# Patient Record
Sex: Female | Born: 1955 | Hispanic: Refuse to answer | Smoking: Former smoker
Health system: Southern US, Community
[De-identification: ages and names within clinical notes are randomized; demographics above are authoritative.]

## PROBLEM LIST (undated history)

## (undated) ENCOUNTER — Inpatient Hospital Stay (HOSPITAL_COMMUNITY): Payer: Self-pay

## (undated) DIAGNOSIS — M810 Age-related osteoporosis without current pathological fracture: Secondary | ICD-10-CM

## (undated) DIAGNOSIS — Z8489 Family history of other specified conditions: Secondary | ICD-10-CM

## (undated) DIAGNOSIS — R102 Pelvic and perineal pain unspecified side: Secondary | ICD-10-CM

## (undated) DIAGNOSIS — M5430 Sciatica, unspecified side: Secondary | ICD-10-CM

## (undated) DIAGNOSIS — K219 Gastro-esophageal reflux disease without esophagitis: Secondary | ICD-10-CM

## (undated) DIAGNOSIS — N920 Excessive and frequent menstruation with regular cycle: Secondary | ICD-10-CM

## (undated) DIAGNOSIS — Z87828 Personal history of other (healed) physical injury and trauma: Secondary | ICD-10-CM

## (undated) DIAGNOSIS — G4700 Insomnia, unspecified: Secondary | ICD-10-CM

## (undated) DIAGNOSIS — N83201 Unspecified ovarian cyst, right side: Secondary | ICD-10-CM

## (undated) DIAGNOSIS — D649 Anemia, unspecified: Secondary | ICD-10-CM

## (undated) DIAGNOSIS — N946 Dysmenorrhea, unspecified: Secondary | ICD-10-CM

## (undated) DIAGNOSIS — G8929 Other chronic pain: Secondary | ICD-10-CM

## (undated) DIAGNOSIS — R531 Weakness: Secondary | ICD-10-CM

## (undated) DIAGNOSIS — G20A1 Parkinson's disease without dyskinesia, without mention of fluctuations: Secondary | ICD-10-CM

## (undated) DIAGNOSIS — R2681 Unsteadiness on feet: Secondary | ICD-10-CM

## (undated) HISTORY — PX: DILATION AND CURETTAGE OF UTERUS: SHX78

## (undated) HISTORY — DX: Gastro-esophageal reflux disease without esophagitis: K21.9

## (undated) HISTORY — PX: TUBAL LIGATION: SHX77

## (undated) HISTORY — DX: Sciatica, unspecified side: M54.30

## (undated) HISTORY — DX: Parkinson's disease without dyskinesia, without mention of fluctuations: G20.A1

## (undated) HISTORY — PX: COLONOSCOPY: SHX174

---

## 2021-06-07 ENCOUNTER — Encounter (HOSPITAL_COMMUNITY): Payer: Self-pay | Admitting: Emergency Medicine

## 2021-06-07 ENCOUNTER — Other Ambulatory Visit: Payer: Self-pay

## 2021-06-07 ENCOUNTER — Ambulatory Visit (INDEPENDENT_AMBULATORY_CARE_PROVIDER_SITE_OTHER): Payer: Medicare Other

## 2021-06-07 ENCOUNTER — Ambulatory Visit (HOSPITAL_COMMUNITY)
Admission: EM | Admit: 2021-06-07 | Discharge: 2021-06-07 | Disposition: A | Discharge status: Home / Self Care | Payer: Medicare Other | Attending: Family Medicine | Admitting: Family Medicine

## 2021-06-07 DIAGNOSIS — M545 Low back pain, unspecified: Secondary | ICD-10-CM

## 2021-06-07 DIAGNOSIS — G8929 Other chronic pain: Secondary | ICD-10-CM | POA: Diagnosis not present

## 2021-06-07 DIAGNOSIS — M5441 Lumbago with sciatica, right side: Secondary | ICD-10-CM | POA: Diagnosis not present

## 2021-06-07 MED ORDER — TIZANIDINE HCL 4 MG PO TABS: 4.0000 mg | ORAL_TABLET | Freq: Three times a day (TID) | ORAL | 0 refills | Status: DC | PRN

## 2021-06-07 MED ORDER — IBUPROFEN 800 MG PO TABS: 800.0000 mg | ORAL_TABLET | Freq: Three times a day (TID) | ORAL | 0 refills | Status: DC | PRN

## 2021-06-07 MED ORDER — KETOROLAC TROMETHAMINE 30 MG/ML IJ SOLN
INTRAMUSCULAR | Status: AC
  Filled 2021-06-07: qty 1

## 2021-06-07 MED ORDER — KETOROLAC TROMETHAMINE 30 MG/ML IJ SOLN
30.0000 mg | Freq: Once | INTRAMUSCULAR | Status: AC
  Administered 2021-06-07: 30 mg via INTRAMUSCULAR

## 2021-06-07 NOTE — Discharge Instructions (Addendum)
.  Your x-rays showed a good bit of arthritis and other degenerative changes and scoliosis the scoliosis can develop from the degenerative changes  Take ibuprofen 800 mg 1 every 8 hours as needed for pain.  Take tizanidine 4 mg 1 tablet every 8 hours as needed for muscle pain or spasms.  This medication can make you sleepy or dizzy.

## 2021-06-07 NOTE — ED Triage Notes (Signed)
Low back pain that radiates to right leg for 2 months, has worsened recently.

## 2021-06-07 NOTE — ED Provider Notes (Signed)
[facility]    CSN: [id number] Arrival date & time: 06/07/21  1724      History   Chief Complaint Chief Complaint  Patient presents with   Back Pain   Leg Pain    HPI Janet Solomon is a 66 y.o. female.    Back Pain Associated symptoms: leg pain   Leg Pain Associated symptoms: back pain   Here with pain that begins in her right buttock area and radiates down to around her right knee.  That began about 2 months ago.  In the last week it has been worse; so much that she cannot sleep well at night  No recent trauma.  She has had an injury to her back when she was lifting heavy boxes several years ago.  No fever, no dysuria, and no hematuria.  History reviewed. No pertinent past medical history.  There are no problems to display for this patient.   History reviewed. No pertinent surgical history.  OB History   No obstetric history on file.      Home Medications    Prior to Admission medications   Medication Sig Start Date End Date Taking? Authorizing Provider  ibuprofen (ADVIL) 800 MG tablet Take 1 tablet (800 mg total) by mouth every 8 (eight) hours as needed (pain). 06/07/21  Yes Camron Monday, Gwenlyn Perking, MD  tiZANidine (ZANAFLEX) 4 MG tablet Take 1 tablet (4 mg total) by mouth every 8 (eight) hours as needed for muscle spasms. 06/07/21  Yes Barrett Henle, MD    Family History No family history on file.  Social History     Allergies   Patient has no known allergies.   Review of Systems Review of Systems  Musculoskeletal:  Positive for back pain.    Physical Exam Triage Vital Signs ED Triage Vitals  Enc Vitals Group     BP 06/07/21 1740 123/87     Pulse Rate 06/07/21 1739 73     Resp 06/07/21 1739 16     Temp 06/07/21 1739 98.3 F (36.8 C)     Temp Source 06/07/21 1739 Oral     SpO2 06/07/21 1739 97 %     Weight --      Height --      Head Circumference --      Peak Flow --      Pain Score 06/07/21 1739 7     Pain  Loc --      Pain Edu? --      Excl. in [place]? --    No data found.  Updated Vital Signs BP 123/87    Pulse 73    Temp 98.3 F (36.8 C) (Oral)    Resp 16    SpO2 97%   Visual Acuity Right Eye Distance:   Left Eye Distance:   Bilateral Distance:    Right Eye Near:   Left Eye Near:    Bilateral Near:     Physical Exam Vitals reviewed.  Constitutional:      General: She is not in acute distress.    Appearance: She is not toxic-appearing.  Cardiovascular:     Rate and Rhythm: Normal rate and regular rhythm.     Heart sounds: No murmur heard. Pulmonary:     Effort: Pulmonary effort is normal.     Breath sounds: Normal breath sounds.  Musculoskeletal:     Comments: Tenderness of right buttock area. SLR causes radiation of pain down to calf.  Skin:  Coloration: Skin is not jaundiced or pale.     Comments: Has some excoriated areas  on her bilateral posterior shoulders, and a little on upper right arm. No sign of secondary infection. No rash seen otherwise  Neurological:     Mental Status: She is alert and oriented to person, place, and time.  Psychiatric:        Behavior: Behavior normal.     UC Treatments / Results  Labs (all labs ordered are listed, but only abnormal results are displayed) Labs Reviewed - No data to display  EKG   Radiology DG Lumbar Spine Complete  Result Date: 06/07/2021 CLINICAL DATA:  low back pain with radiation to right knee EXAM: LUMBAR SPINE - COMPLETE 4+ VIEW COMPARISON:  None. FINDINGS: Markedly limited evaluation due to overlapping osseous structures and overlying soft tissues. Five non-rib-bearing lumbar vertebral bodies. There is no evidence of lumbar spine fracture. Multilevel at least moderate degenerative changes of the spine with rotatory dextroscoliosis of the lumbar spine centered at the L3 level with associated 1 cm rightward translation of L3 on L4. Atherosclerotic plaque. IMPRESSION: 1. No acute displaced fracture or traumatic  listhesis of the lumbar spine in a patient with dextroscoliosis of the lumbar spine and associated degenerative changes. Markedly limited evaluation due to overlapping osseous structures and overlying soft tissues. Consider cross-sectional imaging for further evaluation. 2.  Aortic Atherosclerosis (ICD10-I70.0). Electronically Signed   By: Iven Finn M.D.   On: 06/07/2021 18:06    Procedures Procedures (including critical care time)  Medications Ordered in UC Medications  ketorolac (TORADOL) 30 MG/ML injection 30 mg (has no administration in time range)    Initial Impression / Assessment and Plan / UC Course  I have reviewed the triage vital signs and the nursing notes.  Pertinent labs & imaging results that were available during my care of the patient were reviewed by me and considered in my medical decision making (see chart for details).     Xray shows degenerative changes and scoliosis. Will treat with ibuprofen and tizanidine. Request made to find her a PCP Final Clinical Impressions(s) / UC Diagnoses   Final diagnoses:  Chronic right-sided low back pain with right-sided sciatica     Discharge Instructions      .Your x-rays showed a good bit of arthritis and other degenerative changes and scoliosis the scoliosis can develop from the degenerative changes  Take ibuprofen 800 mg 1 every 8 hours as needed for pain.  Take tizanidine 4 mg 1 tablet every 8 hours as needed for muscle pain or spasms.  This medication can make you sleepy or dizzy.     ED Prescriptions     Medication Sig Dispense Auth. Provider   tiZANidine (ZANAFLEX) 4 MG tablet Take 1 tablet (4 mg total) by mouth every 8 (eight) hours as needed for muscle spasms. 30 tablet Barrett Henle, MD   ibuprofen (ADVIL) 800 MG tablet Take 1 tablet (800 mg total) by mouth every 8 (eight) hours as needed (pain). 60 tablet Andriel Omalley, Gwenlyn Perking, MD      PDMP not reviewed this encounter.   Barrett Henle,  MD 06/07/21 [phone]

## 2021-06-19 ENCOUNTER — Encounter: Payer: Self-pay | Admitting: Family

## 2021-06-19 ENCOUNTER — Other Ambulatory Visit: Payer: Self-pay

## 2021-06-19 ENCOUNTER — Ambulatory Visit (INDEPENDENT_AMBULATORY_CARE_PROVIDER_SITE_OTHER): Payer: Medicare Other | Admitting: Family

## 2021-06-19 VITALS — BP 136/84 | HR 64 | Temp 97.5°F | Ht 60.63 in | Wt 135.8 lb

## 2021-06-19 DIAGNOSIS — M5430 Sciatica, unspecified side: Secondary | ICD-10-CM | POA: Insufficient documentation

## 2021-06-19 DIAGNOSIS — M5431 Sciatica, right side: Secondary | ICD-10-CM

## 2021-06-19 DIAGNOSIS — Z1231 Encounter for screening mammogram for malignant neoplasm of breast: Secondary | ICD-10-CM

## 2021-06-19 DIAGNOSIS — E2839 Other primary ovarian failure: Secondary | ICD-10-CM

## 2021-06-19 MED ORDER — PREDNISONE 10 MG PO TABS: ORAL_TABLET | ORAL | 0 refills | Status: AC

## 2021-06-19 NOTE — Patient Instructions (Signed)
Welcome to [facility]! It was a pleasure meeting you today.  As discussed, I have sent prednisone to your pharmacy to help with your sciatica pain.  I have also sent a referral to Orthopedic office as this has been a chronic problem for you to have imaging and/or MRI to see what else may causing your pain.   See the handout attached and review this for tips/exercises to help you prevent future episodes, once you are feeling a little better.  Let us know if you have any future concerns!     PLEASE NOTE:  If you had any LAB tests please let us know if you have not heard back within a few days. You may see your results on MyChart before we have a chance to review them but we will give you a call once they are reviewed by [place]. If we ordered any REFERRALS today, please let us know if you have not heard from their office within the next week.  Let us know through MyChart if you are needing REFILLS, or have your pharmacy send [place] the request. You can also use MyChart to communicate with me or any office staff.  Please try these tips to maintain a healthy lifestyle:  Eat most of your calories during the day when you are active. Eliminate processed foods including packaged sweets (pies, cakes, cookies), reduce intake of potatoes, white bread, white pasta, and white rice. Look for whole grain options, oat flour or almond flour.  Each meal should contain half fruits/vegetables, one quarter protein, and one quarter carbs (no bigger than a computer mouse).  Cut down on sweet beverages. This includes juice, soda, and sweet tea. Also watch fruit intake, though this is a healthier sweet option, it still contains natural sugar! Limit to 3 servings daily.  Drink at least 1 glass of water with each meal and aim for at least 8 glasses per day  Exercise at least 150 minutes every week.

## 2021-06-19 NOTE — Progress Notes (Signed)
New Patient Office Visit  Subjective:  Patient ID: Janet Solomon, female    DOB: December 05, 1955  Age: 66 y.o. MRN: [id number]  CC:  Chief Complaint  Patient presents with   Establish Care   Sciatica    Follow up from urgent care; 06/07/2021 Pt states that it is worsening. (Rt Leg)    HPI Janet Solomon presents for establishing care and to discuss one chronic problem. Pain She reports chronic sciatic pain on right side. There was not an injury that may have caused the pain. The pain started about a year ago and is gradually worsening. The pain does radiate right leg. The pain is described as burning and tingling, is 9/10 in intensity, occurring every day. Symptoms are worse in the: nighttime  Aggravating factors: recumbency She has tried application of ice, acetaminophen, NSAIDs, and prescription pain relievers with mild relief.   Past Medical History:  Diagnosis Date   GERD (gastroesophageal reflux disease)    Several years    Past Surgical History:  Procedure Laterality Date   CESAREAN SECTION  1986: 1988   TUBAL LIGATION  1988    Family History  Problem Relation Age of Onset   Cancer Mother    Diabetes Mother    Heart disease Mother    Cancer Sister    Varicose Veins Sister    Cancer Sister     Social History   Socioeconomic History   Marital status: Divorced    Spouse name: Not on file   Number of children: Not on file   Years of education: Not on file   Highest education level: Not on file  Occupational History   Not on file  Tobacco Use   Smoking status: Former    Packs/day: 1.50    Years: 15.00    Pack years: 22.50    Types: Cigarettes    Quit date: 07/24/2017    Years since quitting: 3.9   Smokeless tobacco: Never   Tobacco comments:    Quit!  Substance and Sexual Activity   Alcohol use: Not on file   Drug use: Never   Sexual activity: Not Currently  Other Topics Concern   Not on file  Social History Narrative   Not on  file   Social Determinants of Health   Financial Resource Strain: Not on file  Food Insecurity: Not on file  Transportation Needs: Not on file  Physical Activity: Not on file  Stress: Not on file  Social Connections: Not on file  Intimate Partner Violence: Not on file    Objective:   Today's Vitals: BP 136/84    Pulse 64    Temp (!) 97.5 F (36.4 C) (Temporal)    Ht 5' 0.63" (1.54 m) Comment: with shoes   Wt 135 lb 12.8 oz (61.6 kg)    SpO2 97%    BMI 25.97 kg/m   Physical Exam Vitals and nursing note reviewed.  Constitutional:      Appearance: Normal appearance.  Cardiovascular:     Rate and Rhythm: Normal rate and regular rhythm.  Pulmonary:     Effort: Pulmonary effort is normal.     Breath sounds: Normal breath sounds.  Musculoskeletal:     Right hip: Decreased range of motion.  Skin:    General: Skin is warm and dry.  Neurological:     Mental Status: She is alert.  Psychiatric:        Mood and Affect: Mood normal.  Behavior: Behavior normal.    Assessment & Plan:   Problem List Items Addressed This Visit       Nervous and Auditory   Chronic sciatica of right side - Primary    reports having for about a year & 1/2 every day - recently seen for an exacerbated episode 2 weeks ago in urgent care and given steroid injection, Ibuprofen & Tizanidine, but could not tolerate the Tizanidine and pain is returning. Starting a pred pack, advised on use & SE, handout provided on stretches/exercises to try when it is feeling better. Also sending referral, she has questions about spinal injections and if they could help.      Relevant Medications   predniSONE (DELTASONE) 10 MG tablet   Other Relevant Orders   Ambulatory referral to Orthopedic Surgery   Other Visit Diagnoses     Encounter for screening mammogram for breast cancer       Relevant Orders   MM Digital Screening   Estrogen deficiency       Relevant Orders   DG Bone Density       Outpatient  Encounter Medications as of 06/19/2021  Medication Sig   predniSONE (DELTASONE) 10 MG tablet Take 5 tablets (50 mg total) by mouth daily with breakfast for 1 day, THEN 4 tablets (40 mg total) daily with breakfast for 2 days, THEN 3 tablets (30 mg total) daily with breakfast for 1 day, THEN 2 tablets (20 mg total) daily with breakfast for 1 day, THEN 1 tablet (10 mg total) daily with breakfast for 1 day.   [DISCONTINUED] ibuprofen (ADVIL) 800 MG tablet Take 1 tablet (800 mg total) by mouth every 8 (eight) hours as needed (pain). (Patient not taking: Reported on 06/19/2021)   [DISCONTINUED] tiZANidine (ZANAFLEX) 4 MG tablet Take 1 tablet (4 mg total) by mouth every 8 (eight) hours as needed for muscle spasms. (Patient not taking: Reported on 06/19/2021)   No facility-administered encounter medications on file as of 06/19/2021.    Follow-up: Return for any future concerns.   Dulce Sellar, NP

## 2021-06-19 NOTE — Assessment & Plan Note (Addendum)
reports having for about a year & 1/2 every day - recently seen for an exacerbated episode 2 weeks ago in urgent care and given steroid injection, Ibuprofen & Tizanidine, but could not tolerate the Tizanidine and pain is returning. Starting a pred pack, advised on use & SE, handout provided on stretches/exercises to try when it is feeling better. Also sending referral, she has questions about spinal injections and if they could help.

## 2021-06-23 ENCOUNTER — Encounter: Payer: Self-pay | Admitting: Physician Assistant

## 2021-06-23 ENCOUNTER — Ambulatory Visit: Payer: Medicare Other | Admitting: Physician Assistant

## 2021-06-23 ENCOUNTER — Other Ambulatory Visit: Payer: Self-pay

## 2021-06-23 VITALS — Ht 60.0 in | Wt 135.0 lb

## 2021-06-23 DIAGNOSIS — M5431 Sciatica, right side: Secondary | ICD-10-CM | POA: Diagnosis not present

## 2021-06-23 NOTE — Progress Notes (Signed)
Office Visit Note   Patient: Janet Solomon           Date of Birth: [birth year]           MRN: [id number] Visit Date: 06/23/2021              Requested by: Dulce Sellar, NP [address] [place],  [place] [address] PCP: Dulce Sellar, NP  Chief Complaint  Patient presents with   Lower Back - Pain      HPI: Patient is a pleasant 66 year old woman with a 3-year history of lower back pain with associated sciatica.  She thinks this first began 3 years ago with lifting.  She had back pain at first that came and went.  She started developing more pain in her posterior right buttock that is now radiating down into her calf.  She denies any loss of bowel or bladder control she is had no previous back surgery.  She was seen in urgent care as her symptoms have exacerbated and given initially ibuprofen and a muscle relaxer.  She also had now it has been given steroid which she just took the first dose of yesterday so far it has not helped her.  She finds her activity is limited.  She has difficulty sitting to a standing position.  She denies any giving way of her legs or paresthesias.  She has been given exercises to do which are not helpful  Assessment & Plan: Visit Diagnoses:  1. Chronic sciatica of right side     Plan: Patient has a long history of lower back pain that is increasing with significant sciatic symptoms running down her right leg.  She has failed conservative treatment including anti-inflammatories rest and exercises.  Given the severity of her pain I have recommended an MRI.  Her x-rays do demonstrate significant scoliosis and moderate degenerative changes with a scoliosis centered at L3 to the right.  She will follow-up once the MRI is complete she might be a good candidate for epidural steroid injections  Follow-Up Instructions: No follow-ups on file.   Ortho Exam  Patient is alert, oriented, no adenopathy, well-dressed, normal affect, normal  respiratory effort. Examination Pleasant 66 year old woman ambulates slowly and slowly goes from a sitting to bending position.  She has no tenderness over the spine itself.  Bending forward does reproduce right buttock pain.  She has equal strength with dorsiflexion and plantarflexion of her ankles extension and flexion of her knees abduction and abduction and flexion of her hips.  She has a straight leg raise which produces pain in her right thigh.  Straight leg raise on the left also produces pain in her right thigh.  Imaging: No results found. No images are attached to the encounter.  Labs: No results found for: HGBA1C, ESRSEDRATE, CRP, LABURIC, REPTSTATUS, GRAMSTAIN, CULT, LABORGA   No results found for: ALBUMIN, PREALBUMIN, CBC  No results found for: MG No results found for: VD25OH  No results found for: PREALBUMIN No flowsheet data found.   Body mass index is 26.37 kg/m.  Orders:  Orders Placed This Encounter  Procedures   MR Lumbar Spine w/o contrast   No orders of the defined types were placed in this encounter.    Procedures: No procedures performed  Clinical Data: No additional findings.  ROS:  All other systems negative, except as noted in the HPI. Review of Systems  Objective: Vital Signs: Ht 5' (1.524 m)    Wt 135 lb (61.2 kg)  BMI 26.37 kg/m   Specialty Comments:  No specialty comments available.  PMFS History: Patient Active Problem List   Diagnosis Date Noted   Chronic sciatica of right side 06/19/2021   Past Medical History:  Diagnosis Date   GERD (gastroesophageal reflux disease)    Several years    Family History  Problem Relation Age of Onset   Cancer Mother    Diabetes Mother    Heart disease Mother    Cancer Sister    Varicose Veins Sister    Cancer Sister     Past Surgical History:  Procedure Laterality Date   CESAREAN SECTION  1986: 1988   TUBAL LIGATION  1988   Social History   Occupational History   Not on file   Tobacco Use   Smoking status: Former    Packs/day: 1.50    Years: 15.00    Pack years: 22.50    Types: Cigarettes    Quit date: 07/24/2017    Years since quitting: 3.9   Smokeless tobacco: Never   Tobacco comments:    Quit!  Substance and Sexual Activity   Alcohol use: Not on file   Drug use: Never   Sexual activity: Not Currently

## 2021-06-29 ENCOUNTER — Other Ambulatory Visit: Payer: Self-pay | Admitting: Physician Assistant

## 2021-06-29 ENCOUNTER — Telehealth: Payer: Self-pay

## 2021-06-29 MED ORDER — MELOXICAM 15 MG PO TABS: 15.0000 mg | ORAL_TABLET | Freq: Every day | ORAL | 2 refills | Status: DC

## 2021-06-29 NOTE — Telephone Encounter (Signed)
Patient called stating that the prednisone is not working and she has yet to hear from [facility] [employer]. She would like to know if anything else can be proscribed until her next apt for pain ?

## 2021-06-29 NOTE — Telephone Encounter (Signed)
I did speak with the patient she will discontinue the prednisone and try using Mobic.  She does not want anything stronger as she watches her grandchild.  She also has her MRI scheduled for the 18th.  I told her to keep me informed if things got worse

## 2021-07-09 ENCOUNTER — Other Ambulatory Visit: Payer: Self-pay | Admitting: Physician Assistant

## 2021-07-09 ENCOUNTER — Telehealth: Payer: Self-pay | Admitting: Orthopedic Surgery

## 2021-07-09 MED ORDER — DIAZEPAM 5 MG PO TABS: 5.0000 mg | ORAL_TABLET | Freq: Once | ORAL | 0 refills | Status: AC

## 2021-07-09 NOTE — Telephone Encounter (Signed)
Ms. Janet Solomon states that her MRI appt is Saturday 07/11/21. [place] is supposed to call in something to help her relax for the test.  She uses the [facility] [address].  Call back # [phone].

## 2021-07-11 ENCOUNTER — Other Ambulatory Visit: Payer: Self-pay

## 2021-07-11 ENCOUNTER — Ambulatory Visit
Admission: RE | Admit: 2021-07-11 | Discharge: 2021-07-11 | Disposition: A | Discharge status: Home / Self Care | Payer: Medicare Other | Source: Ambulatory Visit | Attending: Physician Assistant | Admitting: Physician Assistant

## 2021-07-11 DIAGNOSIS — M48061 Spinal stenosis, lumbar region without neurogenic claudication: Secondary | ICD-10-CM | POA: Diagnosis not present

## 2021-07-11 DIAGNOSIS — M5431 Sciatica, right side: Secondary | ICD-10-CM

## 2021-07-17 ENCOUNTER — Telehealth: Payer: Self-pay | Admitting: Orthopaedic Surgery

## 2021-07-17 NOTE — Telephone Encounter (Signed)
Pt called requesting  a call back concerning MRI results. Pt states PA Persons called her and pt seems confused on who called her and let vm. Please call pt at [phone].

## 2021-07-20 ENCOUNTER — Other Ambulatory Visit: Payer: Self-pay

## 2021-07-20 DIAGNOSIS — M5431 Sciatica, right side: Secondary | ICD-10-CM

## 2021-07-23 ENCOUNTER — Ambulatory Visit
Admission: RE | Admit: 2021-07-23 | Discharge: 2021-07-23 | Disposition: A | Discharge status: Home / Self Care | Payer: Medicare Other | Source: Ambulatory Visit | Attending: Physician Assistant | Admitting: Physician Assistant

## 2021-07-23 DIAGNOSIS — Z780 Asymptomatic menopausal state: Secondary | ICD-10-CM | POA: Diagnosis not present

## 2021-07-23 DIAGNOSIS — M5431 Sciatica, right side: Secondary | ICD-10-CM

## 2021-07-24 ENCOUNTER — Other Ambulatory Visit: Payer: Self-pay

## 2021-07-24 ENCOUNTER — Telehealth: Payer: Self-pay | Admitting: Physician Assistant

## 2021-07-24 DIAGNOSIS — M5431 Sciatica, right side: Secondary | ICD-10-CM

## 2021-07-24 DIAGNOSIS — N9489 Other specified conditions associated with female genital organs and menstrual cycle: Secondary | ICD-10-CM

## 2021-07-24 NOTE — Telephone Encounter (Signed)
[facility] did a call report on pt's u/s. Asked if we had access to the report and I let her know we could see it in pt's chart. She wanted me to let you know ready for review. ?

## 2021-08-05 ENCOUNTER — Other Ambulatory Visit: Payer: Self-pay

## 2021-08-05 ENCOUNTER — Ambulatory Visit: Payer: Self-pay

## 2021-08-05 ENCOUNTER — Ambulatory Visit: Payer: Medicare Other | Admitting: Physical Medicine and Rehabilitation

## 2021-08-05 ENCOUNTER — Encounter: Payer: Self-pay | Admitting: Physical Medicine and Rehabilitation

## 2021-08-05 VITALS — BP 129/85 | HR 102

## 2021-08-05 DIAGNOSIS — M5416 Radiculopathy, lumbar region: Secondary | ICD-10-CM

## 2021-08-05 MED ORDER — METHYLPREDNISOLONE ACETATE 80 MG/ML IJ SUSP
80.0000 mg | Freq: Once | INTRAMUSCULAR | Status: AC
  Administered 2021-08-05: 80 mg

## 2021-08-05 NOTE — Patient Instructions (Signed)

## 2021-08-05 NOTE — Progress Notes (Signed)
Pt state lower back pain that travels to her right leg. Pt state any movement makes the pain worse. Pt state she takes pain med sot help ease her pain. ? ?Numeric Pain Rating Scale and Functional Assessment ?Average Pain 5 ? ? ?In the last MONTH (on 0-10 scale) has pain interfered with the following? ? ?1. General activity like being  able to carry out your everyday physical activities such as walking, climbing stairs, carrying groceries, or moving a chair?  ?Rating(10) ? ? ?+Driver, -BT, -Dye Allergies. Pt not sure ? ?

## 2021-08-09 NOTE — Progress Notes (Signed)
? ?Janet Solomon - 65 y.o. female MRN [id number]  Date of birth: 09/27/1955 ? ?Office Visit Note: ?Visit Date: 08/05/2021 ?PCP: Dulce Sellar, NP ?Referred by: Dulce Sellar, NP ? ?Subjective: ?Chief Complaint  ?Patient presents with  ? Lower Back - Pain  ? Right Leg - Pain  ? ?HPI:  Janet Solomon is a 66 y.o. female who comes in today at the request of [place] Persons, PA-C for planned Right L5-S1 Lumbar Interlaminar epidural steroid injection with fluoroscopic guidance.  The patient has failed conservative care including home exercise, medications, time and activity modification.  This injection will be diagnostic and hopefully therapeutic.  Please see requesting physician notes for further details and justification. MRI reviewed with images and spine model.  MRI reviewed in the note below.  ? ?ROS Otherwise per HPI. ? ?Assessment & Plan: ?Visit Diagnoses:  ?  ICD-10-CM   ?1. Lumbar radiculopathy  M54.16 XR C-ARM NO REPORT  ?  Epidural Steroid injection  ?  methylPREDNISolone acetate (DEPO-MEDROL) injection 80 mg  ?  ?  ?Plan: No additional findings.  ? ?Meds & Orders:  ?Meds ordered this encounter  ?Medications  ? methylPREDNISolone acetate (DEPO-MEDROL) injection 80 mg  ?  ?Orders Placed This Encounter  ?Procedures  ? XR C-ARM NO REPORT  ? Epidural Steroid injection  ?  ?Follow-up: Return if symptoms worsen or fail to improve.  ? ?Procedures: ?No procedures performed  ?Lumbar Epidural Steroid Injection - Interlaminar Approach with Fluoroscopic Guidance ? ?Patient: Janet Solomon      ?Date of Birth: [birth year] ?MRN: [id number] ?PCP: Dulce Sellar, NP      ?Visit Date: 08/05/2021 ?  ?Universal Protocol:    ? ?Consent Given By: the patient ? ?Position: PRONE ? ?Additional Comments: ?Vital signs were monitored before and after the procedure. ?Patient was prepped and draped in the usual sterile fashion. ?The correct patient, procedure, and site was  verified. ? ? ?Injection Procedure Details:  ? ?Procedure diagnoses: Lumbar radiculopathy [M54.16]  ? ?Meds Administered:  ?Meds ordered this encounter  ?Medications  ? methylPREDNISolone acetate (DEPO-MEDROL) injection 80 mg  ?  ? ?Laterality: Right ? ?Location/Site:  L5-S1 ? ?Needle: 3.5 in., 20 ga. Tuohy ? ?Needle Placement: Paramedian epidural ? ?Findings:  ? -Comments: Excellent flow of contrast into the epidural space. ? ?Procedure Details: ?Using a paramedian approach from the side mentioned above, the region overlying the inferior lamina was localized under fluoroscopic visualization and the soft tissues overlying this structure were infiltrated with 4 ml. of 1% Lidocaine without Epinephrine. The Tuohy needle was inserted into the epidural space using a paramedian approach.  ? ?The epidural space was localized using loss of resistance along with counter oblique bi-planar fluoroscopic views.  After negative aspirate for air, blood, and CSF, a 2 ml. volume of Isovue-250 was injected into the epidural space and the flow of contrast was observed. Radiographs were obtained for documentation purposes.   ? ?The injectate was administered into the level noted above. ? ? ?Additional Comments:  ?The patient tolerated the procedure well ?Dressing: 2 x 2 sterile gauze and Band-Aid ?  ? ?Post-procedure details: ?Patient was observed during the procedure. ?Post-procedure instructions were reviewed. ? ?Patient left the clinic in stable condition.  ? ?Clinical History: ?MRI LUMBAR SPINE WITHOUT CONTRAST ?  ?TECHNIQUE: ?Multiplanar, multisequence MR imaging of the lumbar spine was ?performed. No intravenous contrast was administered. ?  ?COMPARISON:  Radiograph from 06/07/2021. ?  ?FINDINGS: ?Segmentation: Standard. Lowest well-formed disc space labeled  the ?L5-S1 level. ?  ?Alignment: Moderate dextroscoliosis with apex at L2. Chronic ?bilateral pars defects at L5 with associated 12 mm ?spondylolisthesis. Trace stepwise  retrolisthesis with exaggeration ?of the normal lumbar lordosis seen elsewhere within the lumbar ?spine. ?  ?Vertebrae: Vertebral body height maintained without acute or chronic ?fracture. Bone marrow signal intensity diffusely heterogeneous with ?a few scattered benign hemangiomata present. No worrisome osseous ?lesions. No abnormal marrow edema. ?  ?Conus medullaris and cauda equina: Conus extends to the L1 level. ?Conus and cauda equina appear normal. ?  ?Paraspinal and other soft tissues: Paraspinous soft tissues ?demonstrate no acute finding. T2 hyperintense cystic lesion ?measuring up to approximately 5.4 cm seen within the right adnexa, ?partially visualized, and indeterminate (series 8, image 13). ?Remainder of the visualized visceral structures grossly ?unremarkable. ?  ?Disc levels: ?  ?T11-12: Diffuse disc bulge, asymmetric to the right. Right greater ?than left facet hypertrophy. Resultant mild canal stenosis. Moderate ?right foraminal narrowing. Left neural foramina remains patent. ?  ?T12-L1: Diffuse disc bulge with disc desiccation. Reactive endplate ?spurring. Mild left greater than right facet hypertrophy. Resultant ?mild narrowing of the left lateral recess. Central canal remains ?patent. Mild left foraminal narrowing. Right neural foramina remains ?patent. ?  ?L1-2: Degenerative intervertebral disc space narrowing with diffuse ?disc bulge and disc desiccation. Reactive endplate spurring. Mild ?facet hypertrophy. No significant spinal stenosis. Foramina remain ?grossly patent. ?  ?L2-3: Degenerative intervertebral disc space narrowing with diffuse ?disc bulge and disc desiccation. Reactive endplate spurring. ?Moderate right worse than left facet hypertrophy. Mild narrowing of ?the left lateral recess without significant spinal stenosis. Mild ?left L2 foraminal narrowing. Right neural foramen remains patent. ?  ?L3-4: Mild intervertebral disc space narrowing with diffuse disc ?bulge and disc  desiccation. Moderate right with mild left facet ?hypertrophy. No significant spinal stenosis. Moderate right worse ?than left L3 foraminal stenosis. ?  ?L4-5: Disc desiccation. Small central disc protrusion indents the ?ventral thecal sac (series 13, image 31). Moderate right worse than ?left facet hypertrophy. Mild narrowing of the lateral recesses, ?slightly worse on the left. Central canal remains patent. Moderate ?right with mild to moderate left L4 foraminal stenosis. ?  ?L5-S1: Chronic bilateral pars defects with 12 mm spondylolisthesis. ?Advanced intervertebral disc space narrowing with broad posterior ?pseudo disc bulge/uncovering. Moderate bilateral facet hypertrophy. ?No significant spinal stenosis. Severe right worse than left L5 ?foraminal narrowing. ?  ?IMPRESSION: ?1. Chronic bilateral pars defects at L5 with associated 12 mm ?spondylolisthesis, with resultant severe right worse than left L5 ?foraminal stenosis. ?2. Underlying moderate dextroscoliosis with additional moderate ?multilevel spondylosis at L1-2 through L4-5 as above. No other ?high-grade spinal stenosis. Moderate bilateral L3 and L4 foraminal ?narrowing. ?3. 5.4 cm T2 hyperintense cystic lesion within the right adnexa, ?partially visualized, and not fully characterized on this exam. ?Follow-up examination with dedicated pelvic ultrasound recommended ?for further evaluation. ?  ?  ?Electronically Signed ?  By: Rise Mu M.D. ?  On: 07/12/2021 21:25  ? ? ? ?Objective:  VS:  HT:    WT:   BMI:     BP:129/85  HR:(!) 102bpm  TEMP: ( )  RESP:  ?Physical Exam ?Vitals and nursing note reviewed.  ?Constitutional:   ?   General: She is not in acute distress. ?   Appearance: Normal appearance. She is not ill-appearing.  ?HENT:  ?   Head: Normocephalic and atraumatic.  ?   Right Ear: External ear normal.  ?   Left Ear: External ear normal.  ?Eyes:  ?  Extraocular Movements: Extraocular movements intact.  ?Cardiovascular:  ?   Rate  and Rhythm: Normal rate.  ?   Pulses: Normal pulses.  ?Pulmonary:  ?   Effort: Pulmonary effort is normal. No respiratory distress.  ?Abdominal:  ?   General: There is no distension.  ?   Palpations: Abdomen is soft.  ?Musculoskeletal:

## 2021-08-09 NOTE — Procedures (Signed)
Lumbar Epidural Steroid Injection - Interlaminar Approach with Fluoroscopic Guidance ? ?Patient: Janet Solomon      ?Date of Birth: 08/22/1955 ?MRN: [id number] ?PCP: Dulce Sellar, NP      ?Visit Date: 08/05/2021 ?  ?Universal Protocol:    ? ?Consent Given By: the patient ? ?Position: PRONE ? ?Additional Comments: ?Vital signs were monitored before and after the procedure. ?Patient was prepped and draped in the usual sterile fashion. ?The correct patient, procedure, and site was verified. ? ? ?Injection Procedure Details:  ? ?Procedure diagnoses: Lumbar radiculopathy [M54.16]  ? ?Meds Administered:  ?Meds ordered this encounter  ?Medications  ? methylPREDNISolone acetate (DEPO-MEDROL) injection 80 mg  ?  ? ?Laterality: Right ? ?Location/Site:  L5-S1 ? ?Needle: 3.5 in., 20 ga. Tuohy ? ?Needle Placement: Paramedian epidural ? ?Findings:  ? -Comments: Excellent flow of contrast into the epidural space. ? ?Procedure Details: ?Using a paramedian approach from the side mentioned above, the region overlying the inferior lamina was localized under fluoroscopic visualization and the soft tissues overlying this structure were infiltrated with 4 ml. of 1% Lidocaine without Epinephrine. The Tuohy needle was inserted into the epidural space using a paramedian approach.  ? ?The epidural space was localized using loss of resistance along with counter oblique bi-planar fluoroscopic views.  After negative aspirate for air, blood, and CSF, a 2 ml. volume of Isovue-250 was injected into the epidural space and the flow of contrast was observed. Radiographs were obtained for documentation purposes.   ? ?The injectate was administered into the level noted above. ? ? ?Additional Comments:  ?The patient tolerated the procedure well ?Dressing: 2 x 2 sterile gauze and Band-Aid ?  ? ?Post-procedure details: ?Patient was observed during the procedure. ?Post-procedure instructions were reviewed. ? ?Patient left the clinic in  stable condition. ?

## 2021-08-11 ENCOUNTER — Telehealth: Payer: Self-pay | Admitting: Physical Medicine and Rehabilitation

## 2021-08-11 NOTE — Telephone Encounter (Signed)
Pt called requesting a call back from [facility] or dr. Alvester Morin. Pt states she is still experiencing sever pains after injection. Please call pt at [phone] for other options. ?

## 2021-08-12 ENCOUNTER — Other Ambulatory Visit: Payer: Self-pay

## 2021-08-12 ENCOUNTER — Encounter: Payer: Self-pay | Admitting: Physical Medicine and Rehabilitation

## 2021-08-12 ENCOUNTER — Ambulatory Visit: Payer: Medicare Other | Admitting: Physical Medicine and Rehabilitation

## 2021-08-12 VITALS — BP 156/94 | HR 79

## 2021-08-12 DIAGNOSIS — M419 Scoliosis, unspecified: Secondary | ICD-10-CM | POA: Diagnosis not present

## 2021-08-12 DIAGNOSIS — M4726 Other spondylosis with radiculopathy, lumbar region: Secondary | ICD-10-CM | POA: Diagnosis not present

## 2021-08-12 DIAGNOSIS — M5416 Radiculopathy, lumbar region: Secondary | ICD-10-CM

## 2021-08-12 DIAGNOSIS — M4306 Spondylolysis, lumbar region: Secondary | ICD-10-CM

## 2021-08-12 DIAGNOSIS — M4316 Spondylolisthesis, lumbar region: Secondary | ICD-10-CM | POA: Diagnosis not present

## 2021-08-12 MED ORDER — TRAMADOL HCL 50 MG PO TABS: 50.0000 mg | ORAL_TABLET | Freq: Three times a day (TID) | ORAL | 0 refills | Status: DC | PRN

## 2021-08-12 NOTE — Progress Notes (Signed)
? ?Janet Solomon - 66 y.o. female MRN [id number]  Date of birth: 05-19-1956 ? ?Office Visit Note: ?Visit Date: 08/12/2021 ?PCP: Janet Sellar, NP ?Referred by: Janet Sellar, NP ? ?Subjective: ?Chief Complaint  ?Patient presents with  ? Lower Back - Pain  ? Right Thigh - Pain  ? Right Foot - Pain  ? ?HPI: Janet Solomon is a 66 y.o. female who comes in today for evaluation of chronic, worsening and severe right posterior leg pain radiating down to foot.  Patient also reports numbness and tingling sensation to right foot.  Patient reports pain worsened after right L5-S1 interlaminar epidural steroid injection performed in our office on 3/15/23203. Patient states her pain is exacerbated by movement and activity. She describes her pain as a constant sharp, shooting and sore sensation, currently rates as 9 out of 10. Patient states her pain is worse at bedtime and is having difficulty sleeping at night. Patient reports some relief of pain with home exercise regimen, rest and use of medications.  Patient states she she has been taking meloxicam at home with no relief of pain.  Patients recent lumbar MRI exhibits moderate dextroscoliosis with apex at L2, multi-level facet hypertrophy, chronic bilateral pars defects at L5 with associated 12 mm grade 2 spondylolisthesis at L5-S1. No high grade spinal canal stenosis noted. Patient states difficulty completing daily tasks due to severe pain. She is inquiring about other possible treatments and would like to try another lumbar epidural steroid injection if warranted. Patient denies recent trauma or falls. Patient denies bowel/bladder incontinence.  ? ? ?Review of Systems  ?Musculoskeletal:  Positive for back pain.  ?Neurological:  Positive for tingling, focal weakness and weakness.  ?All other systems reviewed and are negative. Otherwise per HPI. ? ?Assessment & Plan: ?Visit Diagnoses:  ?  ICD-10-CM   ?1. Lumbar radiculopathy  M54.16 Ambulatory  referral to Physical Medicine Rehab  ?  ?2. 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  ?3. Scoliosis of lumbar spine, unspecified scoliosis type  M41.9   ?  ?4. Pars defect of lumbar spine  M43.06   ?  ?5. Spondylolisthesis of lumbar region  M43.16   ?  ?   ?Plan: Findings:  ?Chronic, worsening and severe right posterior leg pain radiating down to foot.  Patient continues to have excruciating and debilitating pain despite good conservative therapy such as home exercise regimen, rest and use of medications.  Patient's clinical presentation and exam are consistent with S1 nerve pattern.  She does have weakness noted upon plantar flexion of right foot.  We believe the next step is to perform a diagnostic and hopefully therapeutic right S1 transforaminal epidural steroid injection under fluoroscopic guidance.  If patient's pain persists post epidural injection we did discuss possibility of surgical consultation to discuss possible options. Corrective surgery would likely be a complex surgery. I did provide the patient with a prescription for short course of tramadol to help her better manage her moderate/severe pain until we can get her in for epidural injection.  We feel that we can get patient in quickly for epidural injection.  Patient instructed to let us know if her pain worsens or changes in nature.  Consider adjunctive medication treatment with pregabalin, duloxetine or other nerve membrane stabilizing medications.  ? ?Meds & Orders:  ?Meds ordered this encounter  ?Medications  ? traMADol (ULTRAM) 50 MG tablet  ?  Sig: Take 1 tablet (50 mg total) by mouth every 8 (eight) hours  as needed for moderate pain or severe pain.  ?  Dispense:  15 tablet  ?  Refill:  0  ?  Order Specific Question:   Supervising Provider  ?  AnswerTyrell Solomon:   Janet Solomon [[id number]]  ?  ?Orders Placed This Encounter  ?Procedures  ? Ambulatory referral to Physical Medicine Rehab  ?  ?Follow-up: Return for Right S1  transforaminal epidural steroid injection.  ? ?Procedures: ?No procedures performed  ?   ? ?Clinical History: ?MRI LUMBAR SPINE WITHOUT CONTRAST ?  ?TECHNIQUE: ?Multiplanar, multisequence MR imaging of the lumbar spine was ?performed. No intravenous contrast was administered. ?  ?COMPARISON:  Radiograph from 06/07/2021. ?  ?FINDINGS: ?Segmentation: Standard. Lowest well-formed disc space labeled the ?L5-S1 level. ?  ?Alignment: Moderate dextroscoliosis with apex at L2. Chronic ?bilateral pars defects at L5 with associated 12 mm ?spondylolisthesis. Trace stepwise retrolisthesis with exaggeration ?of the normal lumbar lordosis seen elsewhere within the lumbar ?spine. ?  ?Vertebrae: Vertebral body height maintained without acute or chronic ?fracture. Bone marrow signal intensity diffusely heterogeneous with ?a few scattered benign hemangiomata present. No worrisome osseous ?lesions. No abnormal marrow edema. ?  ?Conus medullaris and cauda equina: Conus extends to the L1 level. ?Conus and cauda equina appear normal. ?  ?Paraspinal and other soft tissues: Paraspinous soft tissues ?demonstrate no acute finding. T2 hyperintense cystic lesion ?measuring up to approximately 5.4 cm seen within the right adnexa, ?partially visualized, and indeterminate (series 8, image 13). ?Remainder of the visualized visceral structures grossly ?unremarkable. ?  ?Disc levels: ?  ?T11-12: Diffuse disc bulge, asymmetric to the right. Right greater ?than left facet hypertrophy. Resultant mild canal stenosis. Moderate ?right foraminal narrowing. Left neural foramina remains patent. ?  ?T12-L1: Diffuse disc bulge with disc desiccation. Reactive endplate ?spurring. Mild left greater than right facet hypertrophy. Resultant ?mild narrowing of the left lateral recess. Central canal remains ?patent. Mild left foraminal narrowing. Right neural foramina remains ?patent. ?  ?L1-2: Degenerative intervertebral disc space narrowing with diffuse ?disc bulge  and disc desiccation. Reactive endplate spurring. Mild ?facet hypertrophy. No significant spinal stenosis. Foramina remain ?grossly patent. ?  ?L2-3: Degenerative intervertebral disc space narrowing with diffuse ?disc bulge and disc desiccation. Reactive endplate spurring. ?Moderate right worse than left facet hypertrophy. Mild narrowing of ?the left lateral recess without significant spinal stenosis. Mild ?left L2 foraminal narrowing. Right neural foramen remains patent. ?  ?L3-4: Mild intervertebral disc space narrowing with diffuse disc ?bulge and disc desiccation. Moderate right with mild left facet ?hypertrophy. No significant spinal stenosis. Moderate right worse ?than left L3 foraminal stenosis. ?  ?L4-5: Disc desiccation. Small central disc protrusion indents the ?ventral thecal sac (series 13, image 31). Moderate right worse than ?left facet hypertrophy. Mild narrowing of the lateral recesses, ?slightly worse on the left. Central canal remains patent. Moderate ?right with mild to moderate left L4 foraminal stenosis. ?  ?L5-S1: Chronic bilateral pars defects with 12 mm spondylolisthesis. ?Advanced intervertebral disc space narrowing with broad posterior ?pseudo disc bulge/uncovering. Moderate bilateral facet hypertrophy. ?No significant spinal stenosis. Severe right worse than left L5 ?foraminal narrowing. ?  ?IMPRESSION: ?1. Chronic bilateral pars defects at L5 with associated 12 mm ?spondylolisthesis, with resultant severe right worse than left L5 ?foraminal stenosis. ?2. Underlying moderate dextroscoliosis with additional moderate ?multilevel spondylosis at L1-2 through L4-5 as above. No other ?high-grade spinal stenosis. Moderate bilateral L3 and L4 foraminal ?narrowing. ?3. 5.4 cm T2 hyperintense cystic lesion within the right adnexa, ?partially visualized, and not fully characterized on  this exam. ?Follow-up examination with dedicated pelvic ultrasound recommended ?for further evaluation. ?  ?   ?Electronically Signed ?  By: Rise Mu M.D. ?  On: 07/12/2021 21:25  ? ?She reports that she quit smoking about 4 years ago. Her smoking use included cigarettes. She has a 22.50 pack-year smoking history.

## 2021-08-12 NOTE — Progress Notes (Signed)
Pt state right buttock pain that travels down posterior right thigh and foot. Pt state any movement makes the pain worse. Pt state she takes pain meds to help ease hr pain. Pt has hx of inj on 08/05/21. ? ?Numeric Pain Rating Scale and Functional Assessment ?Average Pain 10 ?Pain Right Now 7 ?My pain is constant, sharp, burning, and tingling ?Pain is worse with: walking, bending, sitting, standing, and some activites ?Pain improves with: medication and injections ? ? ?In the last MONTH (on 0-10 scale) has pain interfered with the following? ? ?1. General activity like being  able to carry out your everyday physical activities such as walking, climbing stairs, carrying groceries, or moving a chair?  ?Rating(6) ? ?2. Relation with others like being able to carry out your usual social activities and roles such as  activities at home, at work and in your community. ?Rating(7) ? ?3. Enjoyment of life such that you have  been bothered by emotional problems such as feeling anxious, depressed or irritable?  ?Rating(8) ?   ?

## 2021-08-17 ENCOUNTER — Ambulatory Visit: Payer: Medicare Other | Admitting: Obstetrics and Gynecology

## 2021-08-27 ENCOUNTER — Encounter: Payer: Self-pay | Admitting: Physical Medicine and Rehabilitation

## 2021-08-27 ENCOUNTER — Ambulatory Visit: Payer: Self-pay

## 2021-08-27 ENCOUNTER — Ambulatory Visit: Payer: Medicare Other | Admitting: Physical Medicine and Rehabilitation

## 2021-08-27 VITALS — BP 152/81 | HR 76

## 2021-08-27 DIAGNOSIS — M5416 Radiculopathy, lumbar region: Secondary | ICD-10-CM | POA: Diagnosis not present

## 2021-08-27 MED ORDER — METHYLPREDNISOLONE ACETATE 80 MG/ML IJ SUSP
80.0000 mg | Freq: Once | INTRAMUSCULAR | Status: AC
  Administered 2021-08-27: 80 mg

## 2021-08-27 NOTE — Progress Notes (Signed)
Pt state right buttock pain that travels down posterior right thigh and foot. Pt state any movement makes the pain worse. Pt state she takes pain meds to help ease hr pain. ? ?Numeric Pain Rating Scale and Functional Assessment ?Average Pain 6 ? ? ?In the last MONTH (on 0-10 scale) has pain interfered with the following? ? ?1. General activity like being  able to carry out your everyday physical activities such as walking, climbing stairs, carrying groceries, or moving a chair?  ?Rating(10) ? ? ?+Driver, -BT, -Dye Allergies. ? ?

## 2021-08-27 NOTE — Patient Instructions (Signed)

## 2021-08-31 ENCOUNTER — Ambulatory Visit: Payer: Medicare Other | Admitting: Obstetrics and Gynecology

## 2021-08-31 ENCOUNTER — Encounter: Payer: Self-pay | Admitting: Obstetrics and Gynecology

## 2021-08-31 VITALS — BP 136/88 | HR 81 | Ht 60.0 in | Wt 136.0 lb

## 2021-08-31 DIAGNOSIS — N9489 Other specified conditions associated with female genital organs and menstrual cycle: Secondary | ICD-10-CM | POA: Diagnosis not present

## 2021-08-31 NOTE — Progress Notes (Signed)
66 y.o. [id number] (2 c/s). Divorced Other or two or more races Declined female here for evaluation of an adnexal mass.  ? ?She had a MRI in 2/23 for evaluation of back pain and was incidentally noted to have: ?A 5.4 cm T2 hyperintense cystic lesion within the right adnexa, ?partially visualized, and not fully characterized on this exam. ?Follow-up examination with dedicated pelvic ultrasound recommended ?for further evaluation. ? ?No pain, no vaginal bleeding.  ? ?She is having severe sciatica pain on the right. It hurts to sit, she feels it has contributed to her constipation. She typically has a BM 2-3 x a week, now down to 1-2 x a week. She doesn't feel like she empties.  ?She just had her second epidural last week.  ? ?Some urge incontinence, just started 3 weeks.  ?She is voiding frequently, voiding normal amounts, no pain.  ?  ? Not sexually active.  ? ?No LMP recorded. Patient is postmenopausal.          ?Sexually active: No.  ?The current method of family planning is post menopausal status.    ?Exercising: No.   She is having lower back pain.  ?Smoker:  no ? ?Health Maintenance: ?Pap:  long time ago per patient  ?History of abnormal Pap:  yes had cervical surgery in her 20's  ?MMG:  years ago has one ordered  ?BMD:   none  ?Colonoscopy: years ago in [place], 8-10 years ago.   ?TDaP:  unsure  ?Gardasil: n/a ? ? reports that she quit smoking about 4 years ago. Her smoking use included cigarettes. She has a 22.50 pack-year smoking history. She has never used smokeless tobacco. She reports that she does not use drugs. ? ?Past Medical History:  ?Diagnosis Date  ? GERD (gastroesophageal reflux disease)   ? Several years  ? ? ?Past Surgical History:  ?Procedure Laterality Date  ? CESAREAN SECTION  1986: 1988  ? TUBAL LIGATION  1988  ? ? ?Current Outpatient Medications  ?Medication Sig Dispense Refill  ? meloxicam (MOBIC) 15 MG tablet Take 1 tablet (15 mg total) by mouth daily. 30 tablet 2  ? traMADol (ULTRAM) 50 MG  tablet Take 1 tablet (50 mg total) by mouth every 8 (eight) hours as needed for moderate pain or severe pain. 15 tablet 0  ? ?Current Facility-Administered Medications  ?Medication Dose Route Frequency Provider Last Rate Last Admin  ? methylPREDNISolone acetate (DEPO-MEDROL) injection 80 mg  80 mg Other Once Tyrell Antonio, MD      ? ? ?Family History  ?Problem Relation Age of Onset  ? Cancer Mother   ? Diabetes Mother   ? Heart disease Mother   ? Cancer Sister   ? Varicose Veins Sister   ? Cancer Sister   ? ? ?Review of Systems  ?All other systems reviewed and are negative. ? ?Exam:   ?There were no vitals taken for this visit.  Weight change: @WEIGHTCHANGE @ Height:      ?Ht Readings from Last 3 Encounters:  ?06/23/21 5' (1.524 m)  ?06/19/21 5' 0.63" (1.54 m)  ? ? ?General appearance: alert, cooperative and appears stated age ?Abdomen: soft, non-tender; non distended,  no masses,  no organomegaly ? ? ? ?Pelvic: External genitalia:  no lesions ?             Urethra:  normal appearing urethra with no masses, tenderness or lesions ?             Bartholins and Skenes: normal    ?  Vagina: atrophic appearing vagina with normal color and discharge, no lesions ?             Cervix: no lesions ?              ?Bimanual Exam:  Uterus:  normal size, contour, position, consistency, mobility, non-tender ?             Adnexa:  fullness noted in the right adnexa ?              Rectovaginal: Confirms ?              Anus:  normal sphincter tone, no lesions ? ?Carolynn Serve chaperoned for the exam. ? ?1. Adnexal mass ?5 cm right adnexal mass incidentally noted on MRI of her spine. Felt on exam.  ?- US PELVIS TRANSVAGINAL NON-OB (TV ONLY); Future ?-She is also due for a pap and breast exam and will return for that ? ? ? ?

## 2021-09-02 ENCOUNTER — Encounter: Payer: Self-pay | Admitting: Physical Medicine and Rehabilitation

## 2021-09-03 ENCOUNTER — Telehealth: Payer: Self-pay | Admitting: Physical Medicine and Rehabilitation

## 2021-09-03 ENCOUNTER — Other Ambulatory Visit: Payer: Self-pay | Admitting: Physical Medicine and Rehabilitation

## 2021-09-03 NOTE — Telephone Encounter (Signed)
Pt called stating she had an injection and pains are worse. Please call pt at [phone] ?

## 2021-09-15 NOTE — Procedures (Signed)
S1 Lumbosacral Transforaminal Epidural Steroid Injection - Sub-Pedicular Approach with Fluoroscopic Guidance  ? ?Patient: Janet Solomon      ?Date of Birth: 1955/08/27 ?MRN: [id number] ?PCP: Dulce Sellar, NP      ?Visit Date: 08/27/2021 ?  ?Universal Protocol:    ?Date/Time: 09/15/2309:06 AM ? ?Consent Given By: the patient ? ?Position:  PRONE ? ?Additional Comments: ?Vital signs were monitored before and after the procedure. ?Patient was prepped and draped in the usual sterile fashion. ?The correct patient, procedure, and site was verified. ? ? ?Injection Procedure Details:  ?Procedure Site One ?Meds Administered:  ?Meds ordered this encounter  ?Medications  ? methylPREDNISolone acetate (DEPO-MEDROL) injection 80 mg  ? ? ?Laterality: Right ? ?Location/Site:  ?S1 Foramen  ? ?Needle size: 22 ga. ? ?Needle type: Spinal ? ?Needle Placement: Transforaminal ? ?Findings: ? ? -Comments: Excellent flow of contrast along the nerve, nerve root and into the epidural space. ? ?Epidurogram: Contrast epidurogram showed no nerve root cut off or restricted flow pattern. ? ?Procedure Details: ?After squaring off the sacral end-plate to get a true AP view, the C-arm was positioned so that the best possible view of the S1 foramen was visualized. The soft tissues overlying this structure were infiltrated with 2-3 ml. of 1% Lidocaine without Epinephrine.  ? ? ?The spinal needle was inserted toward the target using a "trajectory" view along the fluoroscope beam.  Under AP and lateral visualization, the needle was advanced so it did not puncture dura. Biplanar projections were used to confirm position. Aspiration was confirmed to be negative for CSF and/or blood. A 1-2 ml. volume of Isovue-250 was injected and flow of contrast was noted at each level. Radiographs were obtained for documentation purposes.  ? ?After attaining the desired flow of contrast documented above, a 0.5 to 1.0 ml test dose of 0.25% Marcaine was  injected into each respective transforaminal space.  The patient was observed for 90 seconds post injection.  After no sensory deficits were reported, and normal lower extremity motor function was noted,   the above injectate was administered so that equal amounts of the injectate were placed at each foramen (level) into the transforaminal epidural space. ? ? ?Additional Comments:  ?The patient tolerated the procedure well ?Dressing: Band-Aid with 2 x 2 sterile gauze ?  ? ?Post-procedure details: ?Patient was observed during the procedure. ?Post-procedure instructions were reviewed. ? ?Patient left the clinic in stable condition. ? ?

## 2021-09-15 NOTE — Progress Notes (Signed)
? ?Janet Solomon - 66 y.o. female MRN [id number]  Date of birth: 04/02/1956 ? ?Office Visit Note: ?Visit Date: 08/27/2021 ?PCP: Dulce SellarHudnell, Stephanie, NP ?Referred by: Dulce SellarHudnell, Stephanie, NP ? ?Subjective: ?Chief Complaint  ?Patient presents with  ? Lower Back - Pain  ? Right Thigh - Pain, Numbness, Tingling  ? Right Foot - Pain, Numbness, Tingling  ? ?HPI:  Janet Solomon is a 66 y.o. female who comes in today at the request of Ellin GoodieMegan Williams, FNP for planned Right S1-2 Lumbar Transforaminal epidural steroid injection with fluoroscopic guidance.  The patient has failed conservative care including home exercise, medications, time and activity modification.  This injection will be diagnostic and hopefully therapeutic.  Please see requesting physician notes for further details and justification. ? ?ROS Otherwise per HPI. ? ?Assessment & Plan: ?Visit Diagnoses:  ?  ICD-10-CM   ?1. Lumbar radiculopathy  M54.16 XR C-ARM NO REPORT  ?  Epidural Steroid injection  ?  methylPREDNISolone acetate (DEPO-MEDROL) injection 80 mg  ?  ?  ?Plan: No additional findings.  ? ?Meds & Orders:  ?Meds ordered this encounter  ?Medications  ? methylPREDNISolone acetate (DEPO-MEDROL) injection 80 mg  ?  ?Orders Placed This Encounter  ?Procedures  ? XR C-ARM NO REPORT  ? Epidural Steroid injection  ?  ?Follow-up: Return if symptoms worsen or fail to improve.  ? ?Procedures: ?No procedures performed  ?S1 Lumbosacral Transforaminal Epidural Steroid Injection - Sub-Pedicular Approach with Fluoroscopic Guidance  ? ?Patient: Janet Solomon Solomon      ?Date of Birth: 06/16/1955 ?MRN: [id number] ?PCP: Dulce SellarHudnell, Stephanie, NP      ?Visit Date: 08/27/2021 ?  ?Universal Protocol:    ?Date/Time: 09/15/2309:06 AM ? ?Consent Given By: the patient ? ?Position:  PRONE ? ?Additional Comments: ?Vital signs were monitored before and after the procedure. ?Patient was prepped and draped in the usual sterile fashion. ?The correct patient,  procedure, and site was verified. ? ? ?Injection Procedure Details:  ?Procedure Site One ?Meds Administered:  ?Meds ordered this encounter  ?Medications  ? methylPREDNISolone acetate (DEPO-MEDROL) injection 80 mg  ? ? ?Laterality: Right ? ?Location/Site:  ?S1 Foramen  ? ?Needle size: 22 ga. ? ?Needle type: Spinal ? ?Needle Placement: Transforaminal ? ?Findings: ? ? -Comments: Excellent flow of contrast along the nerve, nerve root and into the epidural space. ? ?Epidurogram: Contrast epidurogram showed no nerve root cut off or restricted flow pattern. ? ?Procedure Details: ?After squaring off the sacral end-plate to get a true AP view, the C-arm was positioned so that the best possible view of the S1 foramen was visualized. The soft tissues overlying this structure were infiltrated with 2-3 ml. of 1% Lidocaine without Epinephrine.  ? ? ?The spinal needle was inserted toward the target using a "trajectory" view along the fluoroscope beam.  Under AP and lateral visualization, the needle was advanced so it did not puncture dura. Biplanar projections were used to confirm position. Aspiration was confirmed to be negative for CSF and/or blood. A 1-2 ml. volume of Isovue-250 was injected and flow of contrast was noted at each level. Radiographs were obtained for documentation purposes.  ? ?After attaining the desired flow of contrast documented above, a 0.5 to 1.0 ml test dose of 0.25% Marcaine was injected into each respective transforaminal space.  The patient was observed for 90 seconds post injection.  After no sensory deficits were reported, and normal lower extremity motor function was noted,   the above injectate was administered so that equal  amounts of the injectate were placed at each foramen (level) into the transforaminal epidural space. ? ? ?Additional Comments:  ?The patient tolerated the procedure well ?Dressing: Band-Aid with 2 x 2 sterile gauze ?  ? ?Post-procedure details: ?Patient was observed during the  procedure. ?Post-procedure instructions were reviewed. ? ?Patient left the clinic in stable condition. ?  ? ?Clinical History: ?MRI LUMBAR SPINE WITHOUT CONTRAST ?  ?TECHNIQUE: ?Multiplanar, multisequence MR imaging of the lumbar spine was ?performed. No intravenous contrast was administered. ?  ?COMPARISON:  Radiograph from 06/07/2021. ?  ?FINDINGS: ?Segmentation: Standard. Lowest well-formed disc space labeled the ?L5-S1 level. ?  ?Alignment: Moderate dextroscoliosis with apex at L2. Chronic ?bilateral pars defects at L5 with associated 12 mm ?spondylolisthesis. Trace stepwise retrolisthesis with exaggeration ?of the normal lumbar lordosis seen elsewhere within the lumbar ?spine. ?  ?Vertebrae: Vertebral body height maintained without acute or chronic ?fracture. Bone marrow signal intensity diffusely heterogeneous with ?a few scattered benign hemangiomata present. No worrisome osseous ?lesions. No abnormal marrow edema. ?  ?Conus medullaris and cauda equina: Conus extends to the L1 level. ?Conus and cauda equina appear normal. ?  ?Paraspinal and other soft tissues: Paraspinous soft tissues ?demonstrate no acute finding. T2 hyperintense cystic lesion ?measuring up to approximately 5.4 cm seen within the right adnexa, ?partially visualized, and indeterminate (series 8, image 13). ?Remainder of the visualized visceral structures grossly ?unremarkable. ?  ?Disc levels: ?  ?T11-12: Diffuse disc bulge, asymmetric to the right. Right greater ?than left facet hypertrophy. Resultant mild canal stenosis. Moderate ?right foraminal narrowing. Left neural foramina remains patent. ?  ?T12-L1: Diffuse disc bulge with disc desiccation. Reactive endplate ?spurring. Mild left greater than right facet hypertrophy. Resultant ?mild narrowing of the left lateral recess. Central canal remains ?patent. Mild left foraminal narrowing. Right neural foramina remains ?patent. ?  ?L1-2: Degenerative intervertebral disc space narrowing with  diffuse ?disc bulge and disc desiccation. Reactive endplate spurring. Mild ?facet hypertrophy. No significant spinal stenosis. Foramina remain ?grossly patent. ?  ?L2-3: Degenerative intervertebral disc space narrowing with diffuse ?disc bulge and disc desiccation. Reactive endplate spurring. ?Moderate right worse than left facet hypertrophy. Mild narrowing of ?the left lateral recess without significant spinal stenosis. Mild ?left L2 foraminal narrowing. Right neural foramen remains patent. ?  ?L3-4: Mild intervertebral disc space narrowing with diffuse disc ?bulge and disc desiccation. Moderate right with mild left facet ?hypertrophy. No significant spinal stenosis. Moderate right worse ?than left L3 foraminal stenosis. ?  ?L4-5: Disc desiccation. Small central disc protrusion indents the ?ventral thecal sac (series 13, image 31). Moderate right worse than ?left facet hypertrophy. Mild narrowing of the lateral recesses, ?slightly worse on the left. Central canal remains patent. Moderate ?right with mild to moderate left L4 foraminal stenosis. ?  ?L5-S1: Chronic bilateral pars defects with 12 mm spondylolisthesis. ?Advanced intervertebral disc space narrowing with broad posterior ?pseudo disc bulge/uncovering. Moderate bilateral facet hypertrophy. ?No significant spinal stenosis. Severe right worse than left L5 ?foraminal narrowing. ?  ?IMPRESSION: ?1. Chronic bilateral pars defects at L5 with associated 12 mm ?spondylolisthesis, with resultant severe right worse than left L5 ?foraminal stenosis. ?2. Underlying moderate dextroscoliosis with additional moderate ?multilevel spondylosis at L1-2 through L4-5 as above. No other ?high-grade spinal stenosis. Moderate bilateral L3 and L4 foraminal ?narrowing. ?3. 5.4 cm T2 hyperintense cystic lesion within the right adnexa, ?partially visualized, and not fully characterized on this exam. ?Follow-up examination with dedicated pelvic ultrasound recommended ?for further  evaluation. ?  ?  ?Electronically Signed ?  By: Rise Mu M.D. ?  On: 07/12/2021 21:25  ? ? ? ?Objective:  VS:  HT:    WT:   BMI:     BP:(!) 152/81  HR:76bpm  TEMP: ( )  RESP:  ?Physical Exam ?Vitals

## 2021-10-05 ENCOUNTER — Other Ambulatory Visit: Payer: Self-pay | Admitting: Physician Assistant

## 2021-10-06 ENCOUNTER — Ambulatory Visit: Payer: Medicare Other | Admitting: Physical Medicine and Rehabilitation

## 2021-10-06 DIAGNOSIS — M4306 Spondylolysis, lumbar region: Secondary | ICD-10-CM

## 2021-10-06 DIAGNOSIS — M4316 Spondylolisthesis, lumbar region: Secondary | ICD-10-CM

## 2021-10-06 DIAGNOSIS — M4186 Other forms of scoliosis, lumbar region: Secondary | ICD-10-CM | POA: Diagnosis not present

## 2021-10-06 DIAGNOSIS — M5416 Radiculopathy, lumbar region: Secondary | ICD-10-CM

## 2021-10-06 DIAGNOSIS — M4726 Other spondylosis with radiculopathy, lumbar region: Secondary | ICD-10-CM

## 2021-10-06 MED ORDER — PREGABALIN 75 MG PO CAPS: ORAL_CAPSULE | ORAL | 1 refills | Status: DC

## 2021-10-06 NOTE — Progress Notes (Signed)
Janet GowdaSonia Ivette Solomon - 66 y.o. female MRN [id number]  Date of birth: 03/23/1956  Office Visit Note: Visit Date: 10/06/2021 PCP: Dulce SellarHudnell, Stephanie, NP Referred by: Dulce SellarHudnell, Stephanie, NP  Subjective: Chief Complaint  Patient presents with   Lower Back - Pain    With radicular leg pain   HPI: Janet Solomon is a 66 y.o. female who comes in today for evaluation of chronic, worsening and severe bilateral lower back pain radiating down posterior legs, when bending she states pain travels to anterior legs. Patient reports pain has been ongoing for several months and is exacerbated by movement and activity. Patient describes pain as sore, aching, tingling and burning sensation, currently rates as 8 out of 10. Patient states her pain worsens at night when sleeping. Patient reports some relief of pain with home exercise regimen, rest and use of medications. Patients recent lumbar MRI exhibits moderate dextroscoliosis with apex at L2, multi-level facet hypertrophy, chronic bilateral pars defects at L5 with associated 12 mm grade 2 spondylolisthesis at L5-S1. No high grade spinal canal stenosis noted. Patient has had 2 lumbar epidural steroid injections performed in our office, right L5-S1 interlaminar epidural steroid injection on 08/05/2021, which she reports worsened pain and right S1 transforaminal epidural steroid injection on 08/27/2021 with no relief of pain. Patient states her severe pain is negatively impacting her life and would like to discuss treatment options.  Patient denies focal weakness, numbness and tingling. Patient denies recent trauma or falls.   Review of Systems  Musculoskeletal:  Positive for back pain.  Neurological:  Positive for tingling. Negative for focal weakness and weakness.  All other systems reviewed and are negative. Otherwise per HPI.  Assessment & Plan: Visit Diagnoses:    ICD-10-CM   1. Lumbar radiculopathy  M54.16 Ambulatory referral to Physical  Therapy    2. 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 Dextroscoliosis of lumbar spine  M41.86     4. Pars defect of lumbar spine  M43.06     5. Spondylolisthesis of lumbar region  M43.16        Plan: Findings:  Chronic, worsening and severe bilateral lower back pain radiating to bilateral posterior legs, with bending pain does radiate to anterior legs. Patient continues to have severe pain despite good conservative therapies such as home exercise regimen, rest and use of medications. We discussed treatment plan in detail today. We do not feel repeating lumbar epidural steroid injection at this time would be beneficial. I did discuss medication management with patient and placed a prescription for Lyrica. Patient instructed to take one tablet by mouth 75 mg for 1 week, then increase to twice a day as tolerated. We did briefly discuss other options such as surgical referral and spinal cord stimulator. We do feel speaking with a surgeon would be beneficial to discuss options, only concern would be complexity of surgery due to dextroscoliosis and PARS defects with 12 mm listhesis at L5. I also placed a order for formal physical therapy with our in house team. Patient would like to proceed with Lyrica and physical therapy at this time. Patient encouraged to remain active and to continue home exercise regimen as tolerated. No red flag symptoms noted upon exam today.    Meds & Orders:  Meds ordered this encounter  Medications   pregabalin (LYRICA) 75 MG capsule    Sig: Take 1 tablet by mouth at night for 5-7 nights, then take 1 tablet in the morning  and 1 tablet at night.    Dispense:  60 capsule    Refill:  1    Order Specific Question:   Supervising Provider    Answer:   Tyrell Antonio [[id number]]    Orders Placed This Encounter  Procedures   Ambulatory referral to Physical Therapy    Follow-up: Return for 6 week follow up.   Procedures: No procedures performed       Clinical History: MRI LUMBAR SPINE WITHOUT CONTRAST   TECHNIQUE: Multiplanar, multisequence MR imaging of the lumbar spine was performed. No intravenous contrast was administered.   COMPARISON:  Radiograph from 06/07/2021.   FINDINGS: Segmentation: Standard. Lowest well-formed disc space labeled the L5-S1 level.   Alignment: Moderate dextroscoliosis with apex at L2. Chronic bilateral pars defects at L5 with associated 12 mm spondylolisthesis. Trace stepwise retrolisthesis with exaggeration of the normal lumbar lordosis seen elsewhere within the lumbar spine.   Vertebrae: Vertebral body height maintained without acute or chronic fracture. Bone marrow signal intensity diffusely heterogeneous with a few scattered benign hemangiomata present. No worrisome osseous lesions. No abnormal marrow edema.   Conus medullaris and cauda equina: Conus extends to the L1 level. Conus and cauda equina appear normal.   Paraspinal and other soft tissues: Paraspinous soft tissues demonstrate no acute finding. T2 hyperintense cystic lesion measuring up to approximately 5.4 cm seen within the right adnexa, partially visualized, and indeterminate (series 8, image 13). Remainder of the visualized visceral structures grossly unremarkable.   Disc levels:   T11-12: Diffuse disc bulge, asymmetric to the right. Right greater than left facet hypertrophy. Resultant mild canal stenosis. Moderate right foraminal narrowing. Left neural foramina remains patent.   T12-L1: Diffuse disc bulge with disc desiccation. Reactive endplate spurring. Mild left greater than right facet hypertrophy. Resultant mild narrowing of the left lateral recess. Central canal remains patent. Mild left foraminal narrowing. Right neural foramina remains patent.   L1-2: Degenerative intervertebral disc space narrowing with diffuse disc bulge and disc desiccation. Reactive endplate spurring. Mild facet hypertrophy. No  significant spinal stenosis. Foramina remain grossly patent.   L2-3: Degenerative intervertebral disc space narrowing with diffuse disc bulge and disc desiccation. Reactive endplate spurring. Moderate right worse than left facet hypertrophy. Mild narrowing of the left lateral recess without significant spinal stenosis. Mild left L2 foraminal narrowing. Right neural foramen remains patent.   L3-4: Mild intervertebral disc space narrowing with diffuse disc bulge and disc desiccation. Moderate right with mild left facet hypertrophy. No significant spinal stenosis. Moderate right worse than left L3 foraminal stenosis.   L4-5: Disc desiccation. Small central disc protrusion indents the ventral thecal sac (series 13, image 31). Moderate right worse than left facet hypertrophy. Mild narrowing of the lateral recesses, slightly worse on the left. Central canal remains patent. Moderate right with mild to moderate left L4 foraminal stenosis.   L5-S1: Chronic bilateral pars defects with 12 mm spondylolisthesis. Advanced intervertebral disc space narrowing with broad posterior pseudo disc bulge/uncovering. Moderate bilateral facet hypertrophy. No significant spinal stenosis. Severe right worse than left L5 foraminal narrowing.   IMPRESSION: 1. 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 2. Underlying moderate dextroscoliosis with additional moderate multilevel spondylosis at L1-2 through L4-5 as above. No other high-grade spinal stenosis. Moderate bilateral L3 and L4 foraminal narrowing. 3. 5.4 cm T2 hyperintense cystic lesion within the right adnexa, partially visualized, and not fully characterized on this exam. Follow-up examination with dedicated  pelvic ultrasound recommended for further evaluation.     Electronically Signed   By: Rise Mu M.D.   On: 07/12/2021 21:25    She reports that she quit smoking about 4 years ago. Her smoking use included cigarettes. She has a 22.50 pack-year smoking history. She has never used smokeless tobacco. No results for input(s): HGBA1C, LABURIC in the last 8760 hours.  Objective:  VS:  HT:    WT:   BMI:     BP:   HR: bpm  TEMP: ( )  RESP:  Physical Exam Vitals and nursing note reviewed.  HENT:     Head: Normocephalic and atraumatic.     Right Ear: External ear normal.     Left Ear: External ear normal.     Nose: Nose normal.     Mouth/Throat:     Mouth: Mucous membranes are moist.  Eyes:     Extraocular Movements: Extraocular movements intact.  Cardiovascular:     Rate and Rhythm: Normal rate.     Pulses: Normal pulses.  Pulmonary:     Effort: Pulmonary effort is normal.  Abdominal:     General: Abdomen is flat. There is no distension.  Musculoskeletal:        General: Tenderness present.     Cervical back: Normal range of motion.     Comments: Pt rises from seated position to standing without difficulty. Good lumbar range of motion. Strong distal strength without clonus, no pain upon palpation of greater trochanters. Paraesthesias to bilateral legs. Sensation intact bilaterally. Walks independently, gait steady.   Skin:    General: Skin is warm and dry.     Capillary Refill: Capillary refill takes less than 2 seconds.  Neurological:     General: No focal deficit present.     Mental Status: She is alert and oriented to person, place, and time.  Psychiatric:        Mood and Affect: Mood normal.        Behavior: Behavior normal.    Ortho Exam  Imaging: No results found.  Past Medical/Family/Surgical/Social History: Medications & Allergies reviewed per EMR, new medications updated. Patient Active Problem List   Diagnosis Date Noted   Chronic sciatica of right side 06/19/2021   Past Medical History:  Diagnosis Date   GERD (gastroesophageal reflux disease)    Several years   Family History   Problem Relation Age of Onset   Cancer Mother    Diabetes Mother    Heart disease Mother    Cancer Sister    Varicose Veins Sister    Cancer Sister    Past Surgical History:  Procedure Laterality Date   CESAREAN SECTION  1986: 1988   TUBAL LIGATION  1988   Social History   Occupational History   Not on file  Tobacco Use   Smoking status: Former    Packs/day: 1.50    Years: 15.00    Pack years: 22.50    Types: Cigarettes    Quit date: 07/24/2017    Years since quitting: 4.2   Smokeless tobacco: Never   Tobacco comments:    Quit!  Substance and Sexual Activity   Alcohol use: Not on file   Drug use: Never   Sexual activity: Not Currently

## 2021-10-06 NOTE — Progress Notes (Signed)
Patient complains of bilateral leg pain with numbness and tingling in both. Reports that her legs are giving way. States she is in constant pain. States that she has not gotten any relief from prior injections.

## 2021-10-13 ENCOUNTER — Encounter: Payer: Self-pay | Admitting: Physical Medicine and Rehabilitation

## 2021-10-21 ENCOUNTER — Encounter: Payer: Self-pay | Admitting: Family

## 2021-10-21 ENCOUNTER — Telehealth: Payer: Medicare Other

## 2021-10-21 ENCOUNTER — Telehealth (INDEPENDENT_AMBULATORY_CARE_PROVIDER_SITE_OTHER): Payer: Medicare Other | Admitting: Family

## 2021-10-21 VITALS — Ht 60.0 in | Wt 136.0 lb

## 2021-10-21 DIAGNOSIS — J069 Acute upper respiratory infection, unspecified: Secondary | ICD-10-CM | POA: Diagnosis not present

## 2021-10-21 MED ORDER — BENZONATATE 200 MG PO CAPS: 200.0000 mg | ORAL_CAPSULE | Freq: Three times a day (TID) | ORAL | 0 refills | Status: AC | PRN

## 2021-10-21 NOTE — Progress Notes (Signed)
MyChart Video Visit    Virtual Visit via Video Note   This visit type was conducted due to national recommendations for restrictions regarding the COVID-19 Pandemic (e.g. social distancing) in an effort to limit this patient's exposure and mitigate transmission in our community. This patient is at least at moderate risk for complications without adequate follow up. This format is felt to be most appropriate for this patient at this time. Physical exam was limited by quality of the video and audio technology used for the visit. CMA was able to get the patient set up on a video visit.  Patient location: Home. Patient and provider in visit Provider location: Office  I discussed the limitations of evaluation and management by telemedicine and the availability of in person appointments. The patient expressed understanding and agreed to proceed.  Visit Date: 10/21/2021  Today's healthcare provider: Dulce Sellar, NP     Subjective:    Patient ID: Janet Solomon, female    DOB: Apr 06, 1956, 66 y.o.   MRN: [id number]  Chief Complaint  Patient presents with   Sore Throat    Pt c/o sore throat, Cough for about 2 days and has been getting worse. Has tried gargling warm salt water.    HPI Sore Throat: Patient complains of sore throat. Associated symptoms include sore throat.Onset of symptoms was 2 days ago, gradually worsening since that time. She is drinking moderate amounts of fluids. She has not had recent close exposure to someone with proven streptococcal pharyngitis. Upper Respiratory Infection: Symptoms include nasal congestion, no  fever, non productive cough, post nasal drip, and sore throat.  Onset of symptoms was 2 days ago, gradually worsening since that time. She is drinking moderate amounts of fluids. Evaluation to date: none.  Treatment to date: none.  Assessment & Plan:   Problem List Items Addressed This Visit   None Visit Diagnoses     Viral upper  respiratory tract infection    -  Primary cough mostly non-productive. Advised on continuing warm salt water gargles, can also try OTC Chloraseptic throat spray and lozenges, 600mg  Ibuprofen tid after eating. sending Tessalon pearles tid prn for cough, advised on use & SE.    Relevant Medications   benzonatate (TESSALON) 200 MG capsule      Past Medical History:  Diagnosis Date   GERD (gastroesophageal reflux disease)    Several years    Past Surgical History:  Procedure Laterality Date   CESAREAN SECTION  1986: 1988   TUBAL LIGATION  1988    Outpatient Medications Prior to Visit  Medication Sig Dispense Refill   pregabalin (LYRICA) 75 MG capsule Take 1 tablet by mouth at night for 5-7 nights, then take 1 tablet in the morning and 1 tablet at night. 60 capsule 1   meloxicam (MOBIC) 15 MG tablet TAKE 1 TABLET (15 MG TOTAL) BY MOUTH DAILY. (Patient not taking: Reported on 10/21/2021) 30 tablet 2   traMADol (ULTRAM) 50 MG tablet Take 1 tablet (50 mg total) by mouth every 8 (eight) hours as needed for moderate pain or severe pain. (Patient not taking: Reported on 10/21/2021) 15 tablet 0   No facility-administered medications prior to visit.    No Known Allergies     Objective:     Physical Exam Vitals and nursing note reviewed.  Constitutional:      General: She is not in acute distress.    Appearance: Normal appearance.  HENT:     Head: Normocephalic.  Pulmonary:  Effort: No respiratory distress.  Musculoskeletal:     Cervical back: Normal range of motion.  Skin:    General: Skin is dry.     Coloration: Skin is not pale.  Neurological:     Mental Status: She is alert and oriented to person, place, and time.  Psychiatric:        Mood and Affect: Mood normal.   Ht 5' (1.524 m)   Wt 136 lb (61.7 kg)   BMI 26.56 kg/m   Wt Readings from Last 3 Encounters:  10/21/21 136 lb (61.7 kg)  08/31/21 136 lb (61.7 kg)  06/23/21 135 lb (61.2 kg)    I discussed the  assessment and treatment plan with the patient. The patient was provided an opportunity to ask questions and all were answered. The patient agreed with the plan and demonstrated an understanding of the instructions.   The patient was advised to call back or seek an in-person evaluation if the symptoms worsen or if the condition fails to improve as anticipated.  I provided 22 minutes of face-to-face time during this encounter.  Dulce Sellar, NP [facility] [place] [phone] (phone) [phone] (fax)  [facility]

## 2021-10-22 ENCOUNTER — Ambulatory Visit: Payer: Medicare Other | Admitting: Rehabilitative and Restorative Service Providers"

## 2021-11-18 ENCOUNTER — Ambulatory Visit: Payer: Medicare Other | Admitting: Physical Medicine and Rehabilitation

## 2021-11-18 ENCOUNTER — Encounter: Payer: Self-pay | Admitting: Physical Medicine and Rehabilitation

## 2021-11-18 ENCOUNTER — Telehealth: Payer: Self-pay | Admitting: Family

## 2021-11-18 DIAGNOSIS — M4726 Other spondylosis with radiculopathy, lumbar region: Secondary | ICD-10-CM

## 2021-11-18 DIAGNOSIS — M4316 Spondylolisthesis, lumbar region: Secondary | ICD-10-CM | POA: Diagnosis not present

## 2021-11-18 DIAGNOSIS — M4186 Other forms of scoliosis, lumbar region: Secondary | ICD-10-CM

## 2021-11-18 DIAGNOSIS — M5416 Radiculopathy, lumbar region: Secondary | ICD-10-CM | POA: Diagnosis not present

## 2021-11-18 DIAGNOSIS — M4306 Spondylolysis, lumbar region: Secondary | ICD-10-CM | POA: Diagnosis not present

## 2021-11-18 NOTE — Progress Notes (Signed)
Janet Solomon - 66 y.o. female MRN [id number]  Date of birth: Jun 07, 1955  Office Visit Note: Visit Date: 11/18/2021 PCP: Dulce Sellar, NP Referred by: Dulce Sellar, NP  Subjective: Chief Complaint  Patient presents with   Lower Back - Pain   HPI: Janet Solomon is a 66 y.o. female who comes in today for evaluation of chronic, worsening and severe bilateral lower back pain radiating down posterior legs, when bending she states pain travels to anterior legs. Pain has been ongoing for several months and is exacerbated by movement and activity, she describes pain as sore, aching and burning sensation, currently rates as 8 out of 10. Some relief of pain with home exercise regimen, rest and use of medications. Patient was recently placed on Lyrica 75 mg, she is now taking twice a day and reports some relief of pain with this medication. During our last office visit in May I placed referral for physical therapy, patient states she has not been able to attend due to financial issues. Patients lumbar MRI from February exhibits moderate dextroscoliosis with apex at L2, multi-level facet hypertrophy, chronic bilateral pars defects at L5 with associated 12 mm grade 2 spondylolisthesis at L5-S1. No high grade spinal canal stenosis noted. Patient has history of 2 lumbar epidural steroid injections performed in our office, right L5-S1 interlaminar epidural steroid injection on 08/05/2021, which she reports worsened pain and right S1 transforaminal epidural steroid injection on 08/27/2021 with no relief of pain. Patient states her pain has improved since her last visit, however her discomfort continues to negatively impact her daily life. Patient denies focal weakness, numbness and tingling. Patient denies recent trauma or falls.    Review of Systems  Musculoskeletal:  Positive for back pain.  All other systems reviewed and are negative.  Otherwise per HPI.  Assessment &  Plan: Visit Diagnoses:    ICD-10-CM   1. Lumbar radiculopathy  M54.16 Ambulatory referral to Physical Therapy    Ambulatory referral to Neurosurgery    2. Other spondylosis with radiculopathy, lumbar region  M47.26 Ambulatory referral to Physical Therapy    Ambulatory referral to Neurosurgery    3. Spondylolisthesis of lumbar region  M43.16 Ambulatory referral to Physical Therapy    Ambulatory referral to Neurosurgery    4. Pars defect of lumbar spine  M43.06 Ambulatory referral to Physical Therapy    Ambulatory referral to Neurosurgery    5. Dextroscoliosis of lumbar spine  M41.86 Ambulatory referral to Physical Therapy    Ambulatory referral to Neurosurgery       Plan: Findings:  Chronic, worsening and severe bilateral lower back pain radiating down posterior legs, pain radiates to anterior legs with bending. Patient continues to have severe pain despite good conservative therapies such as home exercise regimen, rest and use of medications.  Patient's clinical presentation and exam are consistent with S1 nerve pattern.  No relief of pain with previous lumbar epidural steroid injections performed in our office.  Some relief of pain with recent addition of Lyrica to her medication regimen, I instructed patient to continue taking twice a day.  I did discuss treatment plan with patient in detail today, at this point I do not think it would be beneficial to repeat lumbar epidural steroid injection. Patient is requesting another referral be placed for physical therapy which I did complete today. I also talked with patient about surgical consultation to discuss options and ask question. I did place referral to Dr. Monia Pouch at [facility]. Patient encouraged to attend physical therapy and continue with home exercise regimen. Patient instructed to follow up with [place] as needed. No red flag symptoms noted upon exam today.     Meds & Orders: No orders of the defined types were placed in  this encounter.   Orders Placed This Encounter  Procedures   Ambulatory referral to Physical Therapy   Ambulatory referral to Neurosurgery    Follow-up: Return if symptoms worsen or fail to improve.   Procedures: No procedures performed      Clinical History: MRI LUMBAR SPINE WITHOUT CONTRAST   TECHNIQUE: Multiplanar, multisequence MR imaging of the lumbar spine was performed. No intravenous contrast was administered.   COMPARISON:  Radiograph from 06/07/2021.   FINDINGS: Segmentation: Standard. Lowest well-formed disc space labeled the L5-S1 level.   Alignment: Moderate dextroscoliosis with apex at L2. Chronic bilateral pars defects at L5 with associated 12 mm spondylolisthesis. Trace stepwise retrolisthesis with exaggeration of the normal lumbar lordosis seen elsewhere within the lumbar spine.   Vertebrae: Vertebral body height maintained without acute or chronic fracture. Bone marrow signal intensity diffusely heterogeneous with a few scattered benign hemangiomata present. No worrisome osseous lesions. No abnormal marrow edema.   Conus medullaris and cauda equina: Conus extends to the L1 level. Conus and cauda equina appear normal.   Paraspinal and other soft tissues: Paraspinous soft tissues demonstrate no acute finding. T2 hyperintense cystic lesion measuring up to approximately 5.4 cm seen within the right adnexa, partially visualized, and indeterminate (series 8, image 13). Remainder of the visualized visceral structures grossly unremarkable.   Disc levels:   T11-12: Diffuse disc bulge, asymmetric to the right. Right greater than left facet hypertrophy. Resultant mild canal stenosis. Moderate right foraminal narrowing. Left neural foramina remains patent.   T12-L1: Diffuse disc bulge with disc desiccation. Reactive endplate spurring. Mild left greater than right facet hypertrophy. Resultant mild narrowing of the left lateral recess. Central canal  remains patent. Mild left foraminal narrowing. Right neural foramina remains patent.   L1-2: Degenerative intervertebral disc space narrowing with diffuse disc bulge and disc desiccation. Reactive endplate spurring. Mild facet hypertrophy. No significant spinal stenosis. Foramina remain grossly patent.   L2-3: Degenerative intervertebral disc space narrowing with diffuse disc bulge and disc desiccation. Reactive endplate spurring. Moderate right worse than left facet hypertrophy. Mild narrowing of the left lateral recess without significant spinal stenosis. Mild left L2 foraminal narrowing. Right neural foramen remains patent.   L3-4: Mild intervertebral disc space narrowing with diffuse disc bulge and disc desiccation. Moderate right with mild left facet hypertrophy. No significant spinal stenosis. Moderate right worse than left L3 foraminal stenosis.   L4-5: Disc desiccation. Small central disc protrusion indents the ventral thecal sac (series 13, image 31). Moderate right worse than left facet hypertrophy. Mild narrowing of the lateral recesses, slightly worse on the left. Central canal remains patent. Moderate right with mild to moderate left L4 foraminal stenosis.   L5-S1: Chronic bilateral pars defects with 12 mm spondylolisthesis. Advanced intervertebral disc space narrowing with broad posterior pseudo disc bulge/uncovering. Moderate bilateral facet hypertrophy. No significant spinal stenosis. Severe right worse than left L5 foraminal narrowing.   IMPRESSION: 1. 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 2. Underlying moderate dextroscoliosis with additional moderate multilevel spondylosis at L1-2 through L4-5 as above. No other high-grade spinal stenosis. Moderate bilateral L3 and L4 foraminal narrowing. 3. 5.4 cm T2 hyperintense cystic  lesion within the right  adnexa, partially visualized, and not fully characterized on this exam. Follow-up examination with dedicated pelvic ultrasound recommended for further evaluation.     Electronically Signed   By: Rise Mu M.D.   On: 07/12/2021 21:25   She reports that she quit smoking about 4 years ago. Her smoking use included cigarettes. She has a 22.50 pack-year smoking history. She has never used smokeless tobacco. No results for input(s): "HGBA1C", "LABURIC" in the last 8760 hours.  Objective:  VS:  HT:    WT:   BMI:     BP:   HR: bpm  TEMP: ( )  RESP:  Physical Exam Vitals and nursing note reviewed.  HENT:     Head: Normocephalic and atraumatic.     Right Ear: External ear normal.     Left Ear: External ear normal.     Nose: Nose normal.     Mouth/Throat:     Mouth: Mucous membranes are moist.  Eyes:     Extraocular Movements: Extraocular movements intact.  Cardiovascular:     Rate and Rhythm: Normal rate.     Pulses: Normal pulses.  Pulmonary:     Effort: Pulmonary effort is normal.  Abdominal:     General: Abdomen is flat. There is no distension.  Musculoskeletal:        General: Tenderness present.     Cervical back: Normal range of motion.     Comments: Pt is slow to rise from seated position to standing. Good lumbar range of motion. Strong distal strength without clonus, no pain upon palpation of greater trochanters. Dysesthesias noted to bilateral S1 dermatomes. Sensation intact bilaterally. Walks independently, gait steady.   Skin:    General: Skin is warm and dry.     Capillary Refill: Capillary refill takes less than 2 seconds.  Neurological:     General: No focal deficit present.     Mental Status: She is alert and oriented to person, place, and time.  Psychiatric:        Mood and Affect: Mood normal.        Behavior: Behavior normal.     Ortho Exam  Imaging: No results found.  Past Medical/Family/Surgical/Social History: Medications & Allergies  reviewed per EMR, new medications updated. Patient Active Problem List   Diagnosis Date Noted   Chronic sciatica of right side 06/19/2021   Past Medical History:  Diagnosis Date   GERD (gastroesophageal reflux disease)    Several years   Family History  Problem Relation Age of Onset   Cancer Mother    Diabetes Mother    Heart disease Mother    Cancer Sister    Varicose Veins Sister    Cancer Sister    Past Surgical History:  Procedure Laterality Date   CESAREAN SECTION  1986: 1988   TUBAL LIGATION  1988   Social History   Occupational History   Not on file  Tobacco Use   Smoking status: Former    Packs/day: 1.50    Years: 15.00    Total pack years: 22.50    Types: Cigarettes    Quit date: 07/24/2017    Years since quitting: 4.3   Smokeless tobacco: Never   Tobacco comments:    Quit!  Substance and Sexual Activity   Alcohol use: Not on file   Drug use: Never   Sexual activity: Not Currently

## 2021-11-18 NOTE — Telephone Encounter (Signed)
Copied from CRM [id number]. Topic: Medicare AWV >> Nov 18, 2021  2:14 PM Payton Doughty wrote: Reason for CRM: Left message for patient to schedule Annual Wellness Visit.  Please schedule with Nurse Health Advisor Lanier Ensign, RN at [facility].  Please call [phone] ask for [facility]

## 2021-11-18 NOTE — Progress Notes (Signed)
Pt has hx of inj on 08/27/21 pt state it helped for the first day with 80% relief.

## 2021-12-07 ENCOUNTER — Ambulatory Visit: Payer: Medicare Other | Admitting: Physical Therapy

## 2021-12-07 ENCOUNTER — Encounter: Payer: Self-pay | Admitting: Physical Therapy

## 2021-12-07 DIAGNOSIS — M5459 Other low back pain: Secondary | ICD-10-CM | POA: Diagnosis not present

## 2021-12-07 DIAGNOSIS — M6281 Muscle weakness (generalized): Secondary | ICD-10-CM | POA: Diagnosis not present

## 2021-12-07 DIAGNOSIS — R262 Difficulty in walking, not elsewhere classified: Secondary | ICD-10-CM | POA: Diagnosis not present

## 2021-12-07 NOTE — Therapy (Signed)
OUTPATIENT PHYSICAL THERAPY THORACOLUMBAR EVALUATION   Patient Name: Janet Solomon MRN: [id number] DOB:Aug 01, 1955, 65 y.o., female Today's Date: 12/07/2021   PT End of Session - 12/07/21 1445     Visit Number 1    Number of Visits 12    Date for PT Re-Evaluation 01/15/22    PT Start Time 1435    PT Stop Time 1515    PT Time Calculation (min) 40 min    Activity Tolerance Patient tolerated treatment well    Behavior During Therapy WFL for tasks assessed/performed             Past Medical History:  Diagnosis Date   GERD (gastroesophageal reflux disease)    Several years   Past Surgical History:  Procedure Laterality Date   CESAREAN SECTION  1986: 1988   TUBAL LIGATION  1988   Patient Active Problem List   Diagnosis Date Noted   Chronic sciatica of right side 06/19/2021    PCP: Dulce Sellar NP  REFERRING PROVIDER: Juanda Chance, NP  REFERRING DIAG: M54.16 (ICD-10-CM) - Lumbar radiculopathy  Rationale for Evaluation and Treatment Rehabilitation  THERAPY DIAG:  Other low back pain  Difficulty in walking, not elsewhere classified  Muscle weakness (generalized)  ONSET DATE: 7-8 months ago which has worsened  SUBJECTIVE:         Pt stating her pain began at end of last year. Pt stating her is 4/10 at rest. Pt stating her pain can increase to 10/10 at times. Pt stating she has to stop when walking due to SOB and pain. Pt stating getting out of bed is difficult. Pt stating she has pain and stiffness in her Rt posterior LE.                                                                                                                                                                                     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  PAIN:  Are you having pain? Yes: 4/10   Pain location: low back, Rt LE Pain description: pins and needles, achy Aggravating factors: prolonged  positions Relieving factors: changing positions   PRECAUTIONS: Other: scoliosis for DN spine  WEIGHT BEARING RESTRICTIONS No  FALLS:  Has patient fallen in last 6 months? No  LIVING ENVIRONMENT: Lives with: lives with their family Lives in: House/apartment   OCCUPATION: retired  PLOF: Independent  PATIENT GOALS : Be able to perform daily activities without pain   OBJECTIVE:   DIAGNOSTIC FINDINGS:  IMPRESSION: 1. Chronic bilateral pars defects at L5 with associated 12 mm spondylolisthesis, with resultant severe right worse than left L5 foraminal  stenosis. 2. Underlying moderate dextroscoliosis with additional moderate multilevel spondylosis at L1-2 through L4-5 as above. No other high-grade spinal stenosis. Moderate bilateral L3 and L4 foraminal narrowing. 3. 5.4 cm T2 hyperintense cystic lesion within the right adnexa, partially visualized, and not fully characterized on this exam. Follow-up examination with dedicated pelvic ultrasound recommended for further evaluation.  PATIENT SURVEYS:  7/17/23FOTO 47% (predicted 58%)  SCREENING FOR RED FLAGS: Bowel or bladder incontinence: Yes: Pt stating it's been ongoing for about 2 months and it doesn't happen every day Spinal tumors: No Cauda equina syndrome: No   COGNITION:  Overall cognitive status: Within functional limits for tasks assessed     SENSATION: 12/07/2021: pins and needles down Rt LE into foot  MUSCLE LENGTH: 12/07/21 Hamstrings: Right 52 deg; Left 68 deg    PALPATION: TTP: lumbar paraspinals L2-S1   LUMBAR ROM:   Active  A/PROM  eval  Flexion Finger tips to distal knee  Extension 15 degrees, fingertips to lower glutes c pain noted  Right lateral flexion Finger tips to proximal knee  Left lateral flexion Finger tips to proximal knee  Right rotation Limited 50%  Left rotation Limited 50%   (Blank rows = not tested)  LOWER EXTREMITY ROM:     Active  Right eval Left eval  Hip flexion 115  120   (Blank rows = not tested)  LOWER EXTREMITY MMT:    MMT Right eval Left eval  Hip flexion 4/5 5/5  Hip extension 4/5 5/5  Hip abduction 4-/5 4+/5  Hip adduction 4-/5 4+/5  Hip internal rotation    Hip external rotation     (Blank rows = not tested)  LUMBAR SPECIAL TESTS:  12/07/2021: Slump test: Positive on Rt  FUNCTIONAL TESTS:  12/07/21: 5 times sit to stand: 16 seconds with UE support  GAIT: 12/07/21:  Distance walked: 20 feet Assistive device utilized: None Level of assistance: Complete Independence Comments: mild antalgic gait pattern    TODAY'S TREATMENT   12/07/21:  HEP instruction/performance c cues for techniques, handout provided.  Trial set performed of each for comprehension and symptom assessment.  See below for exercise list.   PATIENT EDUCATION:  Education details: PT POC, HEP Person educated: Patient Education method: Explanation, Demonstration, Tactile cues, Verbal cues, and Handouts Education comprehension: verbalized understanding and returned demonstration   HOME EXERCISE PROGRAM: Access Code: [email or web address] URL: [email or web address] Date: 12/07/2021 Prepared by: Narda Amber  Exercises - Supine Lower Trunk Rotation  - 2-3 x daily - 7 x weekly - 3 reps - 20 seconds hold - Supine Posterior Pelvic Tilt  - 2-3 x daily - 7 x weekly - 2 sets - 10 reps - 5 seconds hold - Supine Hamstring Stretch  - 2-3 x daily - 7 x weekly - 3 reps - 20 seconds hold - Seated Sciatic Tensioner  - 2-3 x daily - 7 x weekly - 10 reps - 3 seconds hold - Seated Slump Nerve Glide  - 2-3 x daily - 7 x weekly - 10 reps - 3 seconds hold  ASSESSMENT:  CLINICAL IMPRESSION: Patient is a 66 y.o. who comes to clinic with complaints of mid and low back pain c Rt side sciatica with mobility, strength and movement coordination deficits that impair their ability to perform usual daily and recreational functional activities without increase difficulty/symptoms.   Patient to benefit from skilled PT services to address impairments and limitations to improve to previous level of function without restriction secondary to condition.    OBJECTIVE  IMPAIRMENTS decreased activity tolerance, decreased balance, decreased mobility, difficulty walking, decreased ROM, decreased strength, impaired flexibility, postural dysfunction, and pain.   ACTIVITY LIMITATIONS lifting, bending, sitting, standing, squatting, transfers, and bed mobility  PARTICIPATION LIMITATIONS: cleaning and community activity  PERSONAL FACTORS 3+ comorbidities: GERD, Reflux, c-section, scoliosis  are also affecting patient's functional outcome.   REHAB POTENTIAL: Good  CLINICAL DECISION MAKING: Evolving/moderate complexity  EVALUATION COMPLEXITY: Moderate   GOALS: Goals reviewed with patient? Yes  SHORT TERM GOALS: Target date: 12/28/2021 Patient will demonstrate independent use of initial home exercise program to maintain progress from in clinic treatments. Goal status: New   Long term PT goals : Target dates: 01/22/2022 Patient will demonstrate/report pain at worst less than or equal to 2/10 to facilitate minimal limitation in daily activity secondary to pain symptoms. Goal status: New   Patient will demonstrate independent use of home exercise program to facilitate ability to maintain/progress functional gains from skilled physical therapy services. Goal status: New   Patient will demonstrate FOTO outcome > or = 58 % to indicate reduced disability due to condition. Goal status: New   Pt will improve her bilateral trunk rotation to [facility] with no pain reported to improve functional mobility.  Goal status: New       5.  Pt will improve her Rt LE strength to >/= 5/5 to improve functional mobility and gait.   Goal status: New   PLAN: PT FREQUENCY: 1-2x/week  PT DURATION: 6 weeks  PLANNED INTERVENTIONS: Therapeutic exercises, Therapeutic activity, Neuromuscular re-education,  Balance training, Gait training, Patient/Family education, Self Care, Joint mobilization, Stair training, Dry Needling, Spinal mobilization, Cryotherapy, Moist heat, Taping, Traction, and Manual therapy.  PLAN FOR NEXT SESSION: Assess for extension based exercises, LE strengthening, hamstring stretching, review HEP, sciatic nerve flossing   Sharmon Leyden, PT, MPT 12/07/2021, 2:47 PM

## 2021-12-09 DIAGNOSIS — M4317 Spondylolisthesis, lumbosacral region: Secondary | ICD-10-CM | POA: Diagnosis not present

## 2021-12-09 DIAGNOSIS — M5416 Radiculopathy, lumbar region: Secondary | ICD-10-CM | POA: Diagnosis not present

## 2021-12-09 DIAGNOSIS — M8580 Other specified disorders of bone density and structure, unspecified site: Secondary | ICD-10-CM | POA: Diagnosis not present

## 2021-12-14 DIAGNOSIS — M810 Age-related osteoporosis without current pathological fracture: Secondary | ICD-10-CM | POA: Diagnosis not present

## 2021-12-14 DIAGNOSIS — Z780 Asymptomatic menopausal state: Secondary | ICD-10-CM | POA: Diagnosis not present

## 2021-12-14 DIAGNOSIS — M8589 Other specified disorders of bone density and structure, multiple sites: Secondary | ICD-10-CM | POA: Diagnosis not present

## 2021-12-22 ENCOUNTER — Encounter: Payer: Medicare Other | Admitting: Physical Therapy

## 2021-12-28 ENCOUNTER — Telehealth: Payer: Self-pay | Admitting: Physical Medicine and Rehabilitation

## 2021-12-28 ENCOUNTER — Other Ambulatory Visit: Payer: Self-pay | Admitting: Physical Medicine and Rehabilitation

## 2021-12-28 DIAGNOSIS — M810 Age-related osteoporosis without current pathological fracture: Secondary | ICD-10-CM | POA: Diagnosis not present

## 2021-12-28 DIAGNOSIS — M4317 Spondylolisthesis, lumbosacral region: Secondary | ICD-10-CM | POA: Diagnosis not present

## 2021-12-28 DIAGNOSIS — M5416 Radiculopathy, lumbar region: Secondary | ICD-10-CM | POA: Diagnosis not present

## 2021-12-28 MED ORDER — PREGABALIN 75 MG PO CAPS: ORAL_CAPSULE | ORAL | 1 refills | Status: DC

## 2021-12-28 NOTE — Telephone Encounter (Signed)
Patient called, she would like to [facility] appointment with [facility].

## 2021-12-29 ENCOUNTER — Encounter: Payer: Medicare Other | Admitting: Physical Therapy

## 2021-12-30 ENCOUNTER — Ambulatory Visit: Payer: Medicare Other | Admitting: Physical Medicine and Rehabilitation

## 2022-01-05 ENCOUNTER — Ambulatory Visit: Payer: Medicare Other | Admitting: Physical Therapy

## 2022-01-05 ENCOUNTER — Encounter: Payer: Self-pay | Admitting: Physical Therapy

## 2022-01-05 ENCOUNTER — Encounter: Payer: Medicare Other | Admitting: Physical Therapy

## 2022-01-05 DIAGNOSIS — M6281 Muscle weakness (generalized): Secondary | ICD-10-CM | POA: Diagnosis not present

## 2022-01-05 DIAGNOSIS — M5459 Other low back pain: Secondary | ICD-10-CM | POA: Diagnosis not present

## 2022-01-05 DIAGNOSIS — R262 Difficulty in walking, not elsewhere classified: Secondary | ICD-10-CM

## 2022-01-05 NOTE — Therapy (Signed)
OUTPATIENT PHYSICAL THERAPY TREATMENT NOTE   Patient Name: Janet Solomon MRN: [id number] DOB:March 29, 1956, 66 y.o., female Today's Date: 01/05/2022    END OF SESSION:   PT End of Session - 01/05/22 1521     Visit Number 2    Number of Visits 12    Date for PT Re-Evaluation 01/15/22    PT Start Time 1515    PT Stop Time 1600    PT Time Calculation (min) 45 min    Activity Tolerance Patient tolerated treatment well    Behavior During Therapy WFL for tasks assessed/performed             Past Medical History:  Diagnosis Date   GERD (gastroesophageal reflux disease)    Several years   Past Surgical History:  Procedure Laterality Date   CESAREAN SECTION  1986: 1988   TUBAL LIGATION  1988   Patient Active Problem List   Diagnosis Date Noted   Chronic sciatica of right side 06/19/2021     THERAPY DIAG:  Other low back pain  Difficulty in walking, not elsewhere classified  Muscle weakness (generalized)  PCP: Dulce Sellar NP   REFERRING PROVIDER: Juanda Chance, NP   REFERRING DIAG: M54.16 (ICD-10-CM) - Lumbar radiculopathy   Rationale for Evaluation and Treatment Rehabilitation  ONSET DATE: 7-8 months ago which has worsened   SUBJECTIVE: Pt stating the exercises are not helping. She is getting N/T down her Rt leg and feels like it may give out of her.                                                                                                                                                                                     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   PAIN:  Are you having pain? Yes: 6/10   Pain location: low back, Rt LE Pain description: pins and needles, achy Aggravating factors: prolonged positions Relieving factors: changing positions     PRECAUTIONS: Other: scoliosis for DN spine   WEIGHT BEARING RESTRICTIONS No   FALLS:  Has patient fallen in last 6 months?  No   LIVING ENVIRONMENT: Lives with: lives with their family Lives in: House/apartment     OCCUPATION: retired   PLOF: Independent   PATIENT GOALS : Be able to perform daily activities without pain     OBJECTIVE:    DIAGNOSTIC FINDINGS:  IMPRESSION: 1. 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 2. Underlying moderate dextroscoliosis with additional moderate multilevel spondylosis at L1-2 through L4-5 as above. No other high-grade spinal  stenosis. Moderate bilateral L3 and L4 foraminal narrowing. 3. 5.4 cm T2 hyperintense cystic lesion within the right adnexa, partially visualized, and not fully characterized on this exam. Follow-up examination with dedicated pelvic ultrasound recommended for further evaluation.   PATIENT SURVEYS:  7/17/23FOTO 47% (predicted 58%)   SCREENING FOR RED FLAGS: Bowel or bladder incontinence: Yes: Pt stating it's been ongoing for about 2 months and it doesn't happen every day Spinal tumors: No Cauda equina syndrome: No     COGNITION:           Overall cognitive status: Within functional limits for tasks assessed                          SENSATION: 12/07/2021: pins and needles down Rt LE into foot   MUSCLE LENGTH: 12/07/21 Hamstrings: Right 52 deg; Left 68 deg       PALPATION: TTP: lumbar paraspinals L2-S1    LUMBAR ROM:    Active  A/PROM  eval  Flexion Finger tips to distal knee  Extension 15 degrees, fingertips to lower glutes c pain noted  Right lateral flexion Finger tips to proximal knee  Left lateral flexion Finger tips to proximal knee  Right rotation Limited 50%  Left rotation Limited 50%   (Blank rows = not tested)   LOWER EXTREMITY ROM:      Active  Right eval Left eval  Hip flexion 115 120   (Blank rows = not tested)   LOWER EXTREMITY MMT:     MMT Right eval Left eval  Hip flexion 4/5 5/5  Hip extension  4/5 5/5  Hip abduction 4-/5 4+/5  Hip adduction 4-/5 4+/5  Hip internal rotation      Hip external rotation       (Blank rows = not tested)   LUMBAR SPECIAL TESTS:  12/07/2021: Slump test: Positive on Rt   FUNCTIONAL TESTS:  12/07/21: 5 times sit to stand: 16 seconds with UE support   GAIT: 12/07/21:  Distance walked: 20 feet Assistive device utilized: None Level of assistance: Complete Independence Comments: mild antalgic gait pattern       TODAY'S TREATMENT  01/05/22 -Nu step L5 X 6 min UE/LE seat #5 -SKTC stretch 30 sec X 3 bilat -Supine piriformis stretch 20 sec X 3 bilat (knee to opp shoulder, attempted fig 4 but too painful) -Supine bridges X10 in small Rom -Prone lying 60 sec then pone on elbows stretch 60 sec, repeated this 3 times -Standing hip extensions X 10 bilat in small ROM -Standing lumbar extensions X 10 in small ROM  -Moist heat pack to lumbar X 7 min at end of session   12/07/21:  HEP instruction/performance c cues for techniques, handout provided.  Trial set performed of each for comprehension and symptom assessment.  See below for exercise list.     PATIENT EDUCATION:  Education details: PT POC, HEP Person educated: Patient Education method: Explanation, Demonstration, Tactile cues, Verbal cues, and Handouts Education comprehension: verbalized understanding and returned demonstration     HOME EXERCISE PROGRAM: Revised HEP  Access Code: [email or web address] URL: [email or web address] Date: 01/05/2022 Prepared by: 01/07/2022  Exercises - Hooklying Single Knee to Chest Stretch  - 2 x daily - 6 x weekly - 1 sets - 3 reps - 30 hold - Supine Piriformis Stretch with Foot on Ground  - 2 x daily - 6 x weekly - 1 sets - 3 reps - 20 hold - Supine Bridge  -  2 x daily - 6 x weekly - 1 sets - 10 reps - Lying Prone  - 2 x daily - 6 x weekly - 1 sets - 3 reps - 60 sec hold - Prone on Elbows Stretch  - 2 x daily - 6 x weekly - 1 sets - 3 reps - 60 sec  hold - Standing Hip Extension with Counter Support  - 2 x daily - 6 x weekly - 1-2 sets - 10 reps - Standing Lumbar Extension  - 2 x daily - 6 x weekly - 1 sets - 10 reps - 5 hold  Initial HEP Access Code: [email or web address] URL: [email or web address] Date: 12/07/2021 Prepared by: Kearney Hard   Exercises - Supine Lower Trunk Rotation  - 2-3 x daily - 7 x weekly - 3 reps - 20 seconds hold - Supine Posterior Pelvic Tilt  - 2-3 x daily - 7 x weekly - 2 sets - 10 reps - 5 seconds hold - Supine Hamstring Stretch  - 2-3 x daily - 7 x weekly - 3 reps - 20 seconds hold - Seated Sciatic Tensioner  - 2-3 x daily - 7 x weekly - 10 reps - 3 seconds hold - Seated Slump Nerve Glide  - 2-3 x daily - 7 x weekly - 10 reps - 3 seconds hold   ASSESSMENT:   CLINICAL IMPRESSION: She arrives with complaints of Rt lumbar radiculopathy into her leg. She feels the initial HEP is making things worse. I did trial her on extension based exercises which appeared to help some. I cautioned her about going too far into extension due to her grade 2 spondylolisthesis and to keep in small ROM as to not progress spondylolisthesis any further. I provided her with new HEP to trial and we will assess her response to this next visit.     OBJECTIVE IMPAIRMENTS decreased activity tolerance, decreased balance, decreased mobility, difficulty walking, decreased ROM, decreased strength, impaired flexibility, postural dysfunction, and pain.    ACTIVITY LIMITATIONS lifting, bending, sitting, standing, squatting, transfers, and bed mobility   PARTICIPATION LIMITATIONS: cleaning and community activity   PERSONAL FACTORS 3+ comorbidities: GERD, Reflux, c-section, scoliosis  are also affecting patient's functional outcome.    REHAB POTENTIAL: Good   CLINICAL DECISION MAKING: Evolving/moderate complexity   EVALUATION COMPLEXITY: Moderate     GOALS: Goals reviewed with patient? Yes   SHORT TERM GOALS: Target date:  12/28/2021 Patient will demonstrate independent use of initial home exercise program to maintain progress from in clinic treatments. Goal status: New   Long term PT goals : Target dates: 01/22/2022 Patient will demonstrate/report pain at worst less than or equal to 2/10 to facilitate minimal limitation in daily activity secondary to pain symptoms. Goal status: New   Patient will demonstrate independent use of home exercise program to facilitate ability to maintain/progress functional gains from skilled physical therapy services. Goal status: New   Patient will demonstrate FOTO outcome > or = 58 % to indicate reduced disability due to condition. Goal status: New   Pt will improve her bilateral trunk rotation to [facility] with no pain reported to improve functional mobility.  Goal status: New       5.  Pt will improve her Rt LE strength to >/= 5/5 to improve functional mobility and gait.   Goal status: New     PLAN: PT FREQUENCY: 1-2x/week   PT DURATION: 6 weeks   PLANNED INTERVENTIONS: Therapeutic exercises, Therapeutic activity, Neuromuscular re-education, Balance training, Gait training, Patient/Family education,  Self Care, Joint mobilization, Stair training, Dry Needling, Spinal mobilization, Cryotherapy, Moist heat, Taping, Traction, and Manual therapy.   PLAN FOR NEXT SESSION: how is HEP going and adjust as needed. Consider traction or long axis distraction.   Debbe Odea, PT,DPT 01/05/2022, 4:07 PM

## 2022-01-12 ENCOUNTER — Encounter: Payer: Medicare Other | Admitting: Physical Therapy

## 2022-01-19 ENCOUNTER — Encounter: Payer: Medicare Other | Admitting: Physical Therapy

## 2022-01-21 DIAGNOSIS — M5416 Radiculopathy, lumbar region: Secondary | ICD-10-CM | POA: Diagnosis not present

## 2022-01-26 ENCOUNTER — Encounter: Payer: Medicare Other | Admitting: Physical Therapy

## 2022-02-02 ENCOUNTER — Encounter: Payer: Medicare Other | Admitting: Physical Therapy

## 2022-02-09 ENCOUNTER — Ambulatory Visit: Payer: Medicare Other | Admitting: Physical Therapy

## 2022-02-09 ENCOUNTER — Encounter: Payer: Self-pay | Admitting: Physical Therapy

## 2022-02-09 DIAGNOSIS — M6281 Muscle weakness (generalized): Secondary | ICD-10-CM | POA: Diagnosis not present

## 2022-02-09 DIAGNOSIS — R262 Difficulty in walking, not elsewhere classified: Secondary | ICD-10-CM

## 2022-02-09 DIAGNOSIS — M5459 Other low back pain: Secondary | ICD-10-CM

## 2022-02-09 NOTE — Therapy (Signed)
OUTPATIENT PHYSICAL THERAPY TREATMENT NOTE Re-certification    Patient Name: Janet Solomon MRN: [id number] DOB:1955-09-19, 66 y.o., female Today's Date: 02/09/2022    END OF SESSION:   PT End of Session - 02/09/22 1548     Visit Number 3    Number of Visits 12    Date for PT Re-Evaluation 04/23/22    Authorization Type Recert sent on 3/71/06    PT Start Time 1515    PT Stop Time 1558    PT Time Calculation (min) 43 min    Activity Tolerance Patient tolerated treatment well    Behavior During Therapy WFL for tasks assessed/performed              Past Medical History:  Diagnosis Date   GERD (gastroesophageal reflux disease)    Several years   Past Surgical History:  Procedure Laterality Date   CESAREAN SECTION  1986: [place]   Patient Active Problem List   Diagnosis Date Noted   Chronic sciatica of right side 06/19/2021     THERAPY DIAG:  Other low back pain  Difficulty in walking, not elsewhere classified  Muscle weakness (generalized)  PCP: Jeanie Sewer NP   REFERRING PROVIDER: Lorine Bears, NP   REFERRING DIAG: M54.16 (ICD-10-CM) - Lumbar radiculopathy   Rationale for Evaluation and Treatment Rehabilitation  ONSET DATE: 7-8 months ago which has worsened   SUBJECTIVE: Pt stating she has not been to therapy in a while due to financial restraints. Pt stating pain today is 4/10. Pt stating she has trouble doing her exercises on her memory foam mattress and she can't get up and down off her floor and has limited space.                                                                                                                                                                                      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   PAIN:  Are you having pain? Yes: 4/10   Pain location: low back, Rt LE Pain description: pins and needles, achy Aggravating  factors: prolonged positions Relieving factors: changing positions     PRECAUTIONS: Other: scoliosis for DN spine   WEIGHT BEARING RESTRICTIONS No   FALLS:  Has patient fallen in last 6 months? No   LIVING ENVIRONMENT: Lives with: lives with their family Lives in: House/apartment     OCCUPATION: retired   PLOF: Independent   PATIENT GOALS : Be able to perform daily activities without pain     OBJECTIVE:    DIAGNOSTIC FINDINGS:  IMPRESSION: 1. Chronic bilateral pars  defects at L5 with associated 12 mm spondylolisthesis, with resultant severe right worse than left L5 foraminal stenosis. 2. Underlying moderate dextroscoliosis with additional moderate multilevel spondylosis at L1-2 through L4-5 as above. No other high-grade spinal stenosis. Moderate bilateral L3 and L4 foraminal narrowing. 3. 5.4 cm T2 hyperintense cystic lesion within the right adnexa, partially visualized, and not fully characterized on this exam. Follow-up examination with dedicated pelvic ultrasound recommended for further evaluation.   PATIENT SURVEYS:  7/17/23FOTO 47% (predicted 58%)   SCREENING FOR RED FLAGS: Bowel or bladder incontinence: Yes: Pt stating it's been ongoing for about 2 months and it doesn't happen every day Spinal tumors: No Cauda equina syndrome: No     COGNITION:           Overall cognitive status: Within functional limits for tasks assessed                          SENSATION: 12/07/2021: pins and needles down Rt LE into foot   MUSCLE LENGTH: 12/07/21 Hamstrings: Right 52 deg; Left 68 deg       PALPATION: TTP: lumbar paraspinals L2-S1    LUMBAR ROM:    Active  A/PROM  eval  Flexion Finger tips to distal knee  Extension 15 degrees, fingertips to lower glutes c pain noted  Right lateral flexion Finger tips to proximal knee  Left lateral flexion Finger tips to proximal knee  Right rotation Limited 50%  Left rotation Limited 50%   (Blank rows = not tested)    LOWER EXTREMITY ROM:      Active  Right eval Left eval Rt  02/09/22 supine Left 02/09/22 supine  Hip flexion 115 120 Active 118 Active 120   (Blank rows = not tested)   LOWER EXTREMITY MMT:     MMT Right eval Left eval  Hip flexion 4/5 5/5  Hip extension 4/5 5/5  Hip abduction 4-/5 4+/5  Hip adduction 4-/5 4+/5  Hip internal rotation      Hip external rotation       (Blank rows = not tested)   LUMBAR SPECIAL TESTS:  12/07/2021: Slump test: Positive on Rt   FUNCTIONAL TESTS:  12/07/21: 5 times sit to stand: 16 seconds with UE support   GAIT: 12/07/21:  Distance walked: 20 feet Assistive device utilized: None Level of assistance: Complete Independence Comments: mild antalgic gait pattern       TODAY'S TREATMENT  02/09/22 TherEx:  Nu step L5 X 6 min UE/LE seat #5 PPT: x 15 holding 5 seconds Supine bridges x 10 c decreased lift  Supine figure 4 trunk rotation Supine figure 4 piriformis stretch x 3 holding 20 seconds Standing hip extension: 2 x 10 Step up and down on curb step x 10  Manual:  STM to lumbar paraspinals Percussor device to Rt hamstring and glutes     01/05/22 -Nu step L5 X 6 min UE/LE seat #5 -SKTC stretch 30 sec X 3 bilat -Supine piriformis stretch 20 sec X 3 bilat (knee to opp shoulder, attempted fig 4 but too painful) -Supine bridges X10 in small Rom -Prone lying 60 sec then pone on elbows stretch 60 sec, repeated this 3 times -Standing hip extensions X 10 bilat in small ROM -Standing lumbar extensions X 10 in small ROM  -Moist heat pack to lumbar X 7 min at end of session   12/07/21:  HEP instruction/performance c cues for techniques, handout provided.  Trial set performed of each  for comprehension and symptom assessment.  See below for exercise list.     PATIENT EDUCATION:  Education details: PT POC, HEP Person educated: Patient Education method: Explanation, Demonstration, Tactile cues, Verbal cues, and Handouts Education  comprehension: verbalized understanding and returned demonstration     HOME EXERCISE PROGRAM: Revised HEP  Access Code: [email or web address] URL: [email or web address] Date: 01/05/2022 Prepared by: Elsie Ra  Exercises - Hooklying Single Knee to Chest Stretch  - 2 x daily - 6 x weekly - 1 sets - 3 reps - 30 hold - Supine Piriformis Stretch with Foot on Ground  - 2 x daily - 6 x weekly - 1 sets - 3 reps - 20 hold - Supine Bridge  - 2 x daily - 6 x weekly - 1 sets - 10 reps - Lying Prone  - 2 x daily - 6 x weekly - 1 sets - 3 reps - 60 sec hold - Prone on Elbows Stretch  - 2 x daily - 6 x weekly - 1 sets - 3 reps - 60 sec hold - Standing Hip Extension with Counter Support  - 2 x daily - 6 x weekly - 1-2 sets - 10 reps - Standing Lumbar Extension  - 2 x daily - 6 x weekly - 1 sets - 10 reps - 5 hold  Initial HEP Access Code: [email or web address] URL: [email or web address] Date: 12/07/2021 Prepared by: Kearney Hard   Exercises - Supine Lower Trunk Rotation  - 2-3 x daily - 7 x weekly - 3 reps - 20 seconds hold - Supine Posterior Pelvic Tilt  - 2-3 x daily - 7 x weekly - 2 sets - 10 reps - 5 seconds hold - Supine Hamstring Stretch  - 2-3 x daily - 7 x weekly - 3 reps - 20 seconds hold - Seated Sciatic Tensioner  - 2-3 x daily - 7 x weekly - 10 reps - 3 seconds hold - Seated Slump Nerve Glide  - 2-3 x daily - 7 x weekly - 10 reps - 3 seconds hold   ASSESSMENT:   CLINICAL IMPRESSION: Pt arriving to therapy reporting 4/10 pain radiating down her Rt LE. We reviewed pt's HEP and pt demonstrating compliance. Pt reporting less pain following STM and percussion. Pt has only been able to attend 3 visits due to financial restrictions. Pt has met her STG's. No LTG"s met at this time. I am submitting a reverification for 1x every other week for up to 10 weeks.       OBJECTIVE IMPAIRMENTS decreased activity tolerance, decreased balance, decreased mobility, difficulty walking, decreased  ROM, decreased strength, impaired flexibility, postural dysfunction, and pain.    ACTIVITY LIMITATIONS lifting, bending, sitting, standing, squatting, transfers, and bed mobility   PARTICIPATION LIMITATIONS: cleaning and community activity   PERSONAL FACTORS 3+ comorbidities: GERD, Reflux, c-section, scoliosis  are also affecting patient's functional outcome.    REHAB POTENTIAL: Good   CLINICAL DECISION MAKING: Evolving/moderate complexity   EVALUATION COMPLEXITY: Moderate     GOALS: Goals reviewed with patient? Yes   SHORT TERM GOALS: Target date: 12/28/2021 Patient will demonstrate independent use of initial home exercise program to maintain progress from in clinic treatments. Goal status: MET 02/09/22   Long term PT goals : Target dates: 04/23/22 Patient will demonstrate/report pain at worst less than or equal to 2/10 to facilitate minimal limitation in daily activity secondary to pain symptoms. Goal status: on-going 02/09/22   Patient will demonstrate independent use of home exercise program to facilitate ability to maintain/progress  functional gains from skilled physical therapy services. Goal status: on-going 02/09/22   Patient will demonstrate FOTO outcome > or = 58 % to indicate reduced disability due to condition. Goal status: on-going 02/09/22   Pt will improve her bilateral trunk rotation to [facility] with no pain reported to improve functional mobility.  Goal status: on-going 02/09/22       5.  Pt will improve her Rt LE strength to >/= 5/5 to improve functional mobility and gait.   Goal status: on-going 02/09/22     PLAN: PT FREQUENCY: 1x / week every other week   PT DURATION: 10 weeks   PLANNED INTERVENTIONS: Therapeutic exercises, Therapeutic activity, Neuromuscular re-education, Balance training, Gait training, Patient/Family education, Self Care, Joint mobilization, Stair training, Dry Needling, Spinal mobilization, Cryotherapy, Moist heat, Taping, Traction, and Manual  therapy.   PLAN FOR NEXT SESSION: Continue to progress LE strengthening and lumbar stretching.  Consider traction or long axis distraction.   Oretha Caprice, PT, MPT 02/09/2022, 3:59 PM

## 2022-02-23 ENCOUNTER — Ambulatory Visit: Payer: Medicare Other | Admitting: Physical Therapy

## 2022-02-23 ENCOUNTER — Encounter: Payer: Self-pay | Admitting: Physical Therapy

## 2022-02-23 DIAGNOSIS — R262 Difficulty in walking, not elsewhere classified: Secondary | ICD-10-CM

## 2022-02-23 DIAGNOSIS — M6281 Muscle weakness (generalized): Secondary | ICD-10-CM

## 2022-02-23 DIAGNOSIS — M5459 Other low back pain: Secondary | ICD-10-CM

## 2022-02-23 NOTE — Therapy (Addendum)
OUTPATIENT PHYSICAL THERAPY TREATMENT NOTE Re-certification Discharge    Patient Name: Janet Solomon MRN: [id number] DOB:1956-01-12, 66 y.o., female Today's Date: 02/23/2022    END OF SESSION:   PT End of Session - 02/23/22 1508     Visit Number 4    Number of Visits 12    Date for PT Re-Evaluation 04/23/22    Authorization Type Recert sent on 2/56/38    PT Start Time 1504    PT Stop Time 1549    PT Time Calculation (min) 45 min    Activity Tolerance Patient tolerated treatment well    Behavior During Therapy WFL for tasks assessed/performed              Past Medical History:  Diagnosis Date   GERD (gastroesophageal reflux disease)    Several years   Past Surgical History:  Procedure Laterality Date   CESAREAN SECTION  1986: [place]   Patient Active Problem List   Diagnosis Date Noted   Chronic sciatica of right side 06/19/2021     THERAPY DIAG:  Other low back pain  Difficulty in walking, not elsewhere classified  Muscle weakness (generalized)  PCP: Jeanie Sewer NP   REFERRING PROVIDER: Lorine Bears, NP   REFERRING DIAG: M54.16 (ICD-10-CM) - Lumbar radiculopathy   Rationale for Evaluation and Treatment Rehabilitation  ONSET DATE: 7-8 months ago which has worsened   SUBJECTIVE: Pt stating she broke her toe when she hit it on the stairs so some pain and difficulty walking from this.                                                                                                                                                                                      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   PAIN:  Are you having pain? Yes: 4/10   Pain location: low back, Rt LE Pain description: pins and needles, achy Aggravating factors: prolonged positions Relieving factors: changing positions     PRECAUTIONS: Other: scoliosis for DN spine   WEIGHT  BEARING RESTRICTIONS No   FALLS:  Has patient fallen in last 6 months? No   LIVING ENVIRONMENT: Lives with: lives with their family Lives in: House/apartment     OCCUPATION: retired   PLOF: Independent   PATIENT GOALS : Be able to perform daily activities without pain     OBJECTIVE:    DIAGNOSTIC FINDINGS:  IMPRESSION: 1. 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 2. Underlying moderate dextroscoliosis with additional moderate multilevel  spondylosis at L1-2 through L4-5 as above. No other high-grade spinal stenosis. Moderate bilateral L3 and L4 foraminal narrowing. 3. 5.4 cm T2 hyperintense cystic lesion within the right adnexa, partially visualized, and not fully characterized on this exam. Follow-up examination with dedicated pelvic ultrasound recommended for further evaluation.   PATIENT SURVEYS:  7/17/23FOTO 47% (predicted 58%)   SCREENING FOR RED FLAGS: Bowel or bladder incontinence: Yes: Pt stating it's been ongoing for about 2 months and it doesn't happen every day Spinal tumors: No Cauda equina syndrome: No     COGNITION:           Overall cognitive status: Within functional limits for tasks assessed                          SENSATION: 12/07/2021: pins and needles down Rt LE into foot   MUSCLE LENGTH: 12/07/21 Hamstrings: Right 52 deg; Left 68 deg       PALPATION: TTP: lumbar paraspinals L2-S1    LUMBAR ROM:    Active  A/PROM  eval  Flexion Finger tips to distal knee  Extension 15 degrees, fingertips to lower glutes c pain noted  Right lateral flexion Finger tips to proximal knee  Left lateral flexion Finger tips to proximal knee  Right rotation Limited 50%  Left rotation Limited 50%   (Blank rows = not tested)   LOWER EXTREMITY ROM:      Active  Right eval Left eval Rt  02/09/22 supine Left 02/09/22 supine  Hip flexion 115 120  Active 118 Active 120   (Blank rows = not tested)   LOWER EXTREMITY MMT:     MMT Right eval Left eval  Hip flexion 4/5 5/5  Hip extension 4/5 5/5  Hip abduction 4-/5 4+/5  Hip adduction 4-/5 4+/5  Hip internal rotation      Hip external rotation       (Blank rows = not tested)   LUMBAR SPECIAL TESTS:  12/07/2021: Slump test: Positive on Rt   FUNCTIONAL TESTS:  12/07/21: 5 times sit to stand: 16 seconds with UE support   GAIT: 12/07/21:  Distance walked: 20 feet Assistive device utilized: None Level of assistance: Complete Independence Comments: mild antalgic gait pattern       TODAY'S TREATMENT  02/23/22 TherEx:  Nu step L5 X 10 min UE/LE seat #5 Standing hip abd X 15 bilat with red Standing hip ext X 15 bilat with red Standing hip flexion X 15 bilat with red Standing rows green X 20 Standing shoulder extensions green X 20 Supine bridges x 5 c decreased lift   Mechanical lumbar traction 60-50# intermittent   02/09/22 TherEx:  Nu step L5 X 6 min UE/LE seat #5 PPT: x 15 holding 5 seconds Supine bridges x 10 c decreased lift  Supine figure 4 trunk rotation Supine figure 4 piriformis stretch x 3 holding 20 seconds Standing hip extension: 2 x 10 Step up and down on curb step x 10  Manual:  STM to lumbar paraspinals Percussor device to Rt hamstring and glutes    PATIENT EDUCATION:  Education details: PT POC, HEP Person educated: Patient Education method: Consulting civil engineer, Media planner, Corporate treasurer cues, Verbal cues, and Handouts Education comprehension: verbalized understanding and returned demonstration     HOME EXERCISE PROGRAM: Revised HEP  Access Code: [email or web address] URL: [email or web address] Date: 01/05/2022 Prepared by: Elsie Ra  Exercises - Hooklying Single Knee to Chest Stretch  - 2 x daily - 6 x weekly -  1 sets - 3 reps - 30 hold - Supine Piriformis Stretch with Foot on Ground  - 2 x daily - 6 x weekly - 1 sets - 3 reps - 20 hold -  Supine Bridge  - 2 x daily - 6 x weekly - 1 sets - 10 reps - Lying Prone  - 2 x daily - 6 x weekly - 1 sets - 3 reps - 60 sec hold - Prone on Elbows Stretch  - 2 x daily - 6 x weekly - 1 sets - 3 reps - 60 sec hold - Standing Hip Extension with Counter Support  - 2 x daily - 6 x weekly - 1-2 sets - 10 reps - Standing Lumbar Extension  - 2 x daily - 6 x weekly - 1 sets - 10 reps - 5 hold  Initial HEP Access Code: [email or web address] URL: [email or web address] Date: 12/07/2021 Prepared by: Kearney Hard   Exercises - Supine Lower Trunk Rotation  - 2-3 x daily - 7 x weekly - 3 reps - 20 seconds hold - Supine Posterior Pelvic Tilt  - 2-3 x daily - 7 x weekly - 2 sets - 10 reps - 5 seconds hold - Supine Hamstring Stretch  - 2-3 x daily - 7 x weekly - 3 reps - 20 seconds hold - Seated Sciatic Tensioner  - 2-3 x daily - 7 x weekly - 10 reps - 3 seconds hold - Seated Slump Nerve Glide  - 2-3 x daily - 7 x weekly - 10 reps - 3 seconds hold   ASSESSMENT:   CLINICAL IMPRESSION: Pt arriving to therapy still with pain down her Rt leg along with new complaints of broken toe. We trialed mechanical lumbar traction today to see if this helps with any of her lumbar radicular pain. She did express relief in leg symptoms upon walking after this intervention. We will assess her longer term response to this next session.      OBJECTIVE IMPAIRMENTS decreased activity tolerance, decreased balance, decreased mobility, difficulty walking, decreased ROM, decreased strength, impaired flexibility, postural dysfunction, and pain.    ACTIVITY LIMITATIONS lifting, bending, sitting, standing, squatting, transfers, and bed mobility   PARTICIPATION LIMITATIONS: cleaning and community activity   PERSONAL FACTORS 3+ comorbidities: GERD, Reflux, c-section, scoliosis  are also affecting patient's functional outcome.    REHAB POTENTIAL: Good   CLINICAL DECISION MAKING: Evolving/moderate complexity   EVALUATION  COMPLEXITY: Moderate     GOALS: Goals reviewed with patient? Yes   SHORT TERM GOALS: Target date: 12/28/2021 Patient will demonstrate independent use of initial home exercise program to maintain progress from in clinic treatments. Goal status: MET 02/09/22   Long term PT goals : Target dates: 04/23/22 Patient will demonstrate/report pain at worst less than or equal to 2/10 to facilitate minimal limitation in daily activity secondary to pain symptoms. Goal status: on-going 02/09/22   Patient will demonstrate independent use of home exercise program to facilitate ability to maintain/progress functional gains from skilled physical therapy services. Goal status: on-going 02/09/22   Patient will demonstrate FOTO outcome > or = 58 % to indicate reduced disability due to condition. Goal status: on-going 02/09/22   Pt will improve her bilateral trunk rotation to [facility] with no pain reported to improve functional mobility.  Goal status: on-going 02/09/22       5.  Pt will improve her Rt LE strength to >/= 5/5 to improve functional mobility and gait.   Goal status: on-going 02/09/22  PLAN: PT FREQUENCY: 1x / week every other week   PT DURATION: 10 weeks   PLANNED INTERVENTIONS: Therapeutic exercises, Therapeutic activity, Neuromuscular re-education, Balance training, Gait training, Patient/Family education, Self Care, Joint mobilization, Stair training, Dry Needling, Spinal mobilization, Cryotherapy, Moist heat, Taping, Traction, and Manual therapy.   PLAN FOR NEXT SESSION: Continue to progress LE strengthening and lumbar stretching.  How was traction?  Debbe Odea, PT, DPT 02/23/2022, 3:13 PM  PHYSICAL THERAPY DISCHARGE SUMMARY  Visits from Start of Care: 4  Current functional level related to goals / functional outcomes: See above   Remaining deficits: See above   Education / Equipment: HEP   Patient agrees to discharge. Patient goals were not met. Patient is being discharged due  to not returning since the last visit.  Kearney Hard, PT, MPT 03/23/22 3:37 PM

## 2022-03-03 ENCOUNTER — Other Ambulatory Visit: Payer: Self-pay | Admitting: Physical Medicine and Rehabilitation

## 2022-03-03 MED ORDER — PREGABALIN 75 MG PO CAPS: ORAL_CAPSULE | ORAL | 1 refills | Status: DC

## 2022-03-09 ENCOUNTER — Encounter: Payer: Medicare Other | Admitting: Physical Therapy

## 2022-03-23 ENCOUNTER — Encounter: Payer: Medicare Other | Admitting: Physical Therapy

## 2022-03-26 ENCOUNTER — Ambulatory Visit (INDEPENDENT_AMBULATORY_CARE_PROVIDER_SITE_OTHER): Payer: Medicare Other

## 2022-03-26 VITALS — Wt 136.0 lb

## 2022-03-26 DIAGNOSIS — Z87891 Personal history of nicotine dependence: Secondary | ICD-10-CM

## 2022-03-26 DIAGNOSIS — Z0000 Encounter for general adult medical examination without abnormal findings: Secondary | ICD-10-CM | POA: Diagnosis not present

## 2022-03-26 NOTE — Patient Instructions (Signed)
Janet Solomon , Thank you for taking time to come for your Medicare Wellness Visit. I appreciate your ongoing commitment to your health goals. Please review the following plan we discussed and let me know if I can assist you in the future.   These are the goals we discussed:  Goals      Patient Stated     Maintain health         This is a list of the screening recommended for you and due dates:  Health Maintenance  Topic Date Due   Hepatitis C Screening: USPSTF Recommendation to screen - Ages 77-79 yo.  Never done   Tetanus Vaccine  Never done   Screening for Lung Cancer  Never done   Mammogram  Never done   Zoster (Shingles) Vaccine (1 of 2) Never done   Pneumonia Vaccine (1 - PCV) Never done   DEXA scan (bone density measurement)  Never done   Colon Cancer Screening  05/29/2022   Medicare Annual Wellness Visit  03/27/2023   HPV Vaccine  Aged Out   Flu Shot  Discontinued   COVID-19 Vaccine  Discontinued    Advanced directives: Advance directive discussed with you today. Even though you declined this today please call our office should you change your mind and we can give you the proper paperwork for you to fill out.  Conditions/risks identified: maintain health   Next appointment: Follow up in one year for your annual wellness visit     Preventive Care 65 Years and Older, Female Preventive care refers to lifestyle choices and visits with your health care provider that can promote health and wellness. What does preventive care include? A yearly physical exam. This is also called an annual well check. Dental exams once or twice a year. Routine eye exams. Ask your health care provider how often you should have your eyes checked. Personal lifestyle choices, including: Daily care of your teeth and gums. Regular physical activity. Eating a healthy diet. Avoiding tobacco and drug use. Limiting alcohol use. Practicing safe sex. Taking low-dose aspirin every day. Taking  vitamin and mineral supplements as recommended by your health care provider. What happens during an annual well check? The services and screenings done by your health care provider during your annual well check will depend on your age, overall health, lifestyle risk factors, and family history of disease. Counseling  Your health care provider may ask you questions about your: Alcohol use. Tobacco use. Drug use. Emotional well-being. Home and relationship well-being. Sexual activity. Eating habits. History of falls. Memory and ability to understand (cognition). Work and work Statistician. Reproductive health. Screening  You may have the following tests or measurements: Height, weight, and BMI. Blood pressure. Lipid and cholesterol levels. These may be checked every 5 years, or more frequently if you are over 44 years old. Skin check. Lung cancer screening. You may have this screening every year starting at age 36 if you have a 30-pack-year history of smoking and currently smoke or have quit within the past 15 years. Fecal occult blood test (FOBT) of the stool. You may have this test every year starting at age 49. Flexible sigmoidoscopy or colonoscopy. You may have a sigmoidoscopy every 5 years or a colonoscopy every 10 years starting at age 9. Hepatitis C blood test. Hepatitis B blood test. Sexually transmitted disease (STD) testing. Diabetes screening. This is done by checking your blood sugar (glucose) after you have not eaten for a while (fasting). You may have this  done every 1-3 years. Bone density scan. This is done to screen for osteoporosis. You may have this done starting at age 20. Mammogram. This may be done every 1-2 years. Talk to your health care provider about how often you should have regular mammograms. Talk with your health care provider about your test results, treatment options, and if necessary, the need for more tests. Vaccines  Your health care provider may  recommend certain vaccines, such as: Influenza vaccine. This is recommended every year. Tetanus, diphtheria, and acellular pertussis (Tdap, Td) vaccine. You may need a Td booster every 10 years. Zoster vaccine. You may need this after age 64. Pneumococcal 13-valent conjugate (PCV13) vaccine. One dose is recommended after age 55. Pneumococcal polysaccharide (PPSV23) vaccine. One dose is recommended after age 39. Talk to your health care provider about which screenings and vaccines you need and how often you need them. This information is not intended to replace advice given to you by your health care provider. Make sure you discuss any questions you have with your health care provider. Document Released: 06/06/2015 Document Revised: 01/28/2016 Document Reviewed: 03/11/2015 Elsevier Interactive Patient Education  2017 [place] Prevention in the Home Falls can cause injuries. They can happen to people of all ages. There are many things you can do to make your home safe and to help prevent falls. What can I do on the outside of my home? Regularly fix the edges of walkways and driveways and fix any cracks. Remove anything that might make you trip as you walk through a door, such as a raised step or threshold. Trim any bushes or trees on the path to your home. Use bright outdoor lighting. Clear any walking paths of anything that might make someone trip, such as rocks or tools. Regularly check to see if handrails are loose or broken. Make sure that both sides of any steps have handrails. Any raised decks and porches should have guardrails on the edges. Have any leaves, snow, or ice cleared regularly. Use sand or salt on walking paths during winter. Clean up any spills in your garage right away. This includes oil or grease spills. What can I do in the bathroom? Use night lights. Install grab bars by the toilet and in the tub and shower. Do not use towel bars as grab bars. Use non-skid  mats or decals in the tub or shower. If you need to sit down in the shower, use a plastic, non-slip stool. Keep the floor dry. Clean up any water that spills on the floor as soon as it happens. Remove soap buildup in the tub or shower regularly. Attach bath mats securely with double-sided non-slip rug tape. Do not have throw rugs and other things on the floor that can make you trip. What can I do in the bedroom? Use night lights. Make sure that you have a light by your bed that is easy to reach. Do not use any sheets or blankets that are too big for your bed. They should not hang down onto the floor. Have a firm chair that has side arms. You can use this for support while you get dressed. Do not have throw rugs and other things on the floor that can make you trip. What can I do in the kitchen? Clean up any spills right away. Avoid walking on wet floors. Keep items that you use a lot in easy-to-reach places. If you need to reach something above you, use a strong step stool that  has a grab bar. Keep electrical cords out of the way. Do not use floor polish or wax that makes floors slippery. If you must use wax, use non-skid floor wax. Do not have throw rugs and other things on the floor that can make you trip. What can I do with my stairs? Do not leave any items on the stairs. Make sure that there are handrails on both sides of the stairs and use them. Fix handrails that are broken or loose. Make sure that handrails are as long as the stairways. Check any carpeting to make sure that it is firmly attached to the stairs. Fix any carpet that is loose or worn. Avoid having throw rugs at the top or bottom of the stairs. If you do have throw rugs, attach them to the floor with carpet tape. Make sure that you have a light switch at the top of the stairs and the bottom of the stairs. If you do not have them, ask someone to add them for you. What else can I do to help prevent falls? Wear shoes  that: Do not have high heels. Have rubber bottoms. Are comfortable and fit you well. Are closed at the toe. Do not wear sandals. If you use a stepladder: Make sure that it is fully opened. Do not climb a closed stepladder. Make sure that both sides of the stepladder are locked into place. Ask someone to hold it for you, if possible. Clearly mark and make sure that you can see: Any grab bars or handrails. First and last steps. Where the edge of each step is. Use tools that help you move around (mobility aids) if they are needed. These include: Canes. Walkers. Scooters. Crutches. Turn on the lights when you go into a dark area. Replace any light bulbs as soon as they burn out. Set up your furniture so you have a clear path. Avoid moving your furniture around. If any of your floors are uneven, fix them. If there are any pets around you, be aware of where they are. Review your medicines with your doctor. Some medicines can make you feel dizzy. This can increase your chance of falling. Ask your doctor what other things that you can do to help prevent falls. This information is not intended to replace advice given to you by your health care provider. Make sure you discuss any questions you have with your health care provider. Document Released: 03/06/2009 Document Revised: 10/16/2015 Document Reviewed: 06/14/2014 Elsevier Interactive Patient Education  2017 [employer].

## 2022-03-26 NOTE — Progress Notes (Addendum)
I connected with  Janet Solomon on 03/26/22 by a audio enabled telemedicine application and verified that I am speaking with the correct person using two identifiers.  Patient Location: Home  Provider Location: Office/Clinic  I discussed the limitations of evaluation and management by telemedicine. The patient expressed understanding and agreed to proceed.   Subjective:   Janet Solomon is a 66 y.o. female who presents for an Initial Medicare Annual Wellness Visit.  Review of Systems     Cardiac Risk Factors include: advanced age (>61men, >49 women);sedentary lifestyle     Objective:    Today's Vitals   03/26/22 1422  Weight: 136 lb (61.7 kg)   Body mass index is 26.56 kg/m.     03/26/2022    2:27 PM 12/07/2021    2:45 PM  Advanced Directives  Does Patient Have a Medical Advance Directive? No No  Would patient like information on creating a medical advance directive? No - Patient declined No - Patient declined    Current Medications (verified) Outpatient Encounter Medications as of 03/26/2022  Medication Sig   pregabalin (LYRICA) 75 MG capsule Take 1 tablet 75 mg by mouth twice a day, morning and bedtime.   [DISCONTINUED] meloxicam (MOBIC) 15 MG tablet TAKE 1 TABLET (15 MG TOTAL) BY MOUTH DAILY.   [DISCONTINUED] traMADol (ULTRAM) 50 MG tablet Take 1 tablet (50 mg total) by mouth every 8 (eight) hours as needed for moderate pain or severe pain.   No facility-administered encounter medications on file as of 03/26/2022.    Allergies (verified) Patient has no known allergies.   History: Past Medical History:  Diagnosis Date   GERD (gastroesophageal reflux disease)    Several years   Past Surgical History:  Procedure Laterality Date   CESAREAN SECTION  1986: 1988   TUBAL LIGATION  1988   Family History  Problem Relation Age of Onset   Cancer Mother    Diabetes Mother    Heart disease Mother    Cancer Sister    Varicose Veins Sister     Cancer Sister    Social History   Socioeconomic History   Marital status: Divorced    Spouse name: Not on file   Number of children: Not on file   Years of education: Not on file   Highest education level: Not on file  Occupational History   Not on file  Tobacco Use   Smoking status: Former    Packs/day: 1.50    Years: 15.00    Total pack years: 22.50    Types: Cigarettes    Quit date: 07/24/2017    Years since quitting: 4.6   Smokeless tobacco: Never   Tobacco comments:    Quit!  Substance and Sexual Activity   Alcohol use: Not on file   Drug use: Never   Sexual activity: Not Currently  Other Topics Concern   Not on file  Social History Narrative   Not on file   Social Determinants of Health   Financial Resource Strain: Low Risk  (03/26/2022)   Overall Financial Resource Strain (CARDIA)    Difficulty of Paying Living Expenses: Not hard at all  Food Insecurity: No Food Insecurity (03/26/2022)   Hunger Vital Sign    Worried About Running Out of Food in the Last Year: Never true    Ran Out of Food in the Last Year: Never true  Transportation Needs: No Transportation Needs (03/26/2022)   PRAPARE - Administrator, Civil Service (Medical):  No    Lack of Transportation (Non-Medical): No  Physical Activity: Inactive (03/26/2022)   Exercise Vital Sign    Days of Exercise per Week: 0 days    Minutes of Exercise per Session: 0 min  Stress: No Stress Concern Present (03/26/2022)   [place]    Feeling of Stress : Not at all  Social Connections: Socially Isolated (03/26/2022)   Social Connection and Isolation Panel [NHANES]    Frequency of Communication with Friends and Family: Once a week    Frequency of Social Gatherings with Friends and Family: Once a week    Attends Religious Services: 1 to 4 times per year    Active Member of [employer] or Organizations: No    Attends Archivist Meetings:  Never    Marital Status: Divorced    Tobacco Counseling Counseling given: Not Answered Tobacco comments: Quit!   Clinical Intake:  Pre-visit preparation completed: Yes  Pain : No/denies pain     BMI - recorded: 26.56 Nutritional Status: BMI 25 -29 Overweight Nutritional Risks: None Diabetes: No  How often do you need to have someone help you when you read instructions, pamphlets, or other written materials from your doctor or pharmacy?: 1 - Never  Diabetic?no  Interpreter Needed?: No  Information entered by :: Charlott Rakes, LPN   Activities of Daily Living    03/26/2022    2:29 PM 03/23/2022   12:40 PM  In your present state of health, do you have any difficulty performing the following activities:  Hearing? 0 0  Vision? 0 0  Difficulty concentrating or making decisions? 0 0  Walking or climbing stairs? 0 0  Dressing or bathing? 0 0  Doing errands, shopping? 0 0  Preparing Food and eating ? N N  Using the Toilet? N N  In the past six months, have you accidently leaked urine? N Y  Do you have problems with loss of bowel control? N N  Managing your Medications? N N  Managing your Finances? N N  Housekeeping or managing your Housekeeping? N N    Patient Care Team: Jeanie Sewer, NP as PCP - General (Family Medicine)  Indicate any recent Medical Services you may have received from other than [facility] providers in the past year (date may be approximate).     Assessment:   This is a routine wellness examination for Janet Solomon.  Hearing/Vision screen Hearing Screening - Comments:: Pt denies any hearing issue Vision Screening - Comments:: Encouraged to follow up with provider who handles  macular degeneration   Dietary issues and exercise activities discussed: Current Exercise Habits: The patient does not participate in regular exercise at present   Goals Addressed             This Visit's Progress    Patient Stated       Maintain health         Depression Screen    03/26/2022    2:25 PM 06/19/2021    2:23 PM  PHQ 2/9 Scores  PHQ - 2 Score 0 0    Fall Risk    03/26/2022    2:29 PM 03/23/2022   12:40 PM  [place] in the past year? 0 0  Number falls in past yr: 0   Injury with Fall? 0   Risk for fall due to : Impaired vision;Impaired mobility   Follow up Falls prevention discussed     FALL RISK  PREVENTION PERTAINING TO THE HOME:  Any stairs in or around the home? Yes  If so, are there any without handrails? No  Home free of loose throw rugs in walkways, pet beds, electrical cords, etc? Yes  Adequate lighting in your home to reduce risk of falls? Yes   ASSISTIVE DEVICES UTILIZED TO PREVENT FALLS:  Life alert? No  Use of a cane, walker or w/c? No  Grab bars in the bathroom? No  Shower chair or bench in shower? No  Elevated toilet seat or a handicapped toilet? No   TIMED UP AND GO:  Was the test performed? No .   Cognitive Function:        03/26/2022    2:31 PM  6CIT Screen  What Year? 0 points  What month? 0 points  What time? 0 points  Count back from 20 0 points  Months in reverse 0 points  Repeat phrase 0 points  Total Score 0 points    Immunizations  There is no immunization history on file for this patient.  TDAP status: Due, Education has been provided regarding the importance of this vaccine. Advised may receive this vaccine at local pharmacy or Health Dept. Aware to provide a copy of the vaccination record if obtained from local pharmacy or Health Dept. Verbalized acceptance and understanding.  Flu Vaccine status: Declined, Education has been provided regarding the importance of this vaccine but patient still declined. Advised may receive this vaccine at local pharmacy or Health Dept. Aware to provide a copy of the vaccination record if obtained from local pharmacy or Health Dept. Verbalized acceptance and understanding.  Pneumococcal vaccine status: Declined,  Education has been  provided regarding the importance of this vaccine but patient still declined. Advised may receive this vaccine at local pharmacy or Health Dept. Aware to provide a copy of the vaccination record if obtained from local pharmacy or Health Dept. Verbalized acceptance and understanding.   Covid-19 vaccine status: Declined, Education has been provided regarding the importance of this vaccine but patient still declined. Advised may receive this vaccine at local pharmacy or Health Dept.or vaccine clinic. Aware to provide a copy of the vaccination record if obtained from local pharmacy or Health Dept. Verbalized acceptance and understanding.  Qualifies for Shingles Vaccine? Yes   Zostavax completed No   Shingrix Completed?: No.    Education has been provided regarding the importance of this vaccine. Patient has been advised to call insurance company to determine out of pocket expense if they have not yet received this vaccine. Advised may also receive vaccine at local pharmacy or Health Dept. Verbalized acceptance and understanding.  Screening Tests Health Maintenance  Topic Date Due   Hepatitis C Screening  Never done   TETANUS/TDAP  Never done   Lung Cancer Screening  Never done   MAMMOGRAM  Never done   DEXA SCAN  Never done   Zoster Vaccines- Shingrix (1 of 2) 06/26/2022 (Originally 01/28/2004)   Pneumonia Vaccine 35+ Years old (1 - PCV) 03/27/2023 (Originally 01/28/2019)   COLONOSCOPY (Pts 45-77yrs Insurance coverage will need to be confirmed)  05/29/2022   Medicare Annual Wellness (AWV)  03/27/2023   HPV VACCINES  Aged Out   INFLUENZA VACCINE  Discontinued   COVID-19 Vaccine  Discontinued    Health Maintenance  Health Maintenance Due  Topic Date Due   Hepatitis C Screening  Never done   TETANUS/TDAP  Never done   Lung Cancer Screening  Never done   MAMMOGRAM  Never  done   DEXA SCAN  Never done    Colorectal cancer screening: Type of screening: Colonoscopy. Completed 05/29/12. Repeat every  10 years  Mammogram postpone at this time   Bone density postpone at this time   Lung Cancer Screening: (Low Dose CT Chest recommended if Age 35-80 years, 30 pack-year currently smoking OR have quit w/in 15years.) does qualify.   Lung Cancer Screening Referral: order placed 03/26/22  Additional Screening:  Hepatitis C Screening: does qualify;  Vision Screening: Recommended annual ophthalmology exams for early detection of glaucoma and other disorders of the eye. Is the patient up to date with their annual eye exam?  No  Who is the provider or what is the name of the office in which the patient attends annual eye exams? Would like referral for eye Dr  If pt is not established with a provider, would they like to be referred to a provider to establish care? Yes .   Dental Screening: Recommended annual dental exams for proper oral hygiene  Community Resource Referral / Chronic Care Management: CRR required this visit?  No   CCM required this visit?  No      Plan:     I have personally reviewed and noted the following in the patient's chart:   Medical and social history Use of alcohol, tobacco or illicit drugs  Current medications and supplements including opioid prescriptions. Patient is not currently taking opioid prescriptions. Functional ability and status Nutritional status Physical activity Advanced directives List of other physicians Hospitalizations, surgeries, and ER visits in previous 12 months Vitals Screenings to include cognitive, depression, and falls Referrals and appointments  In addition, I have reviewed and discussed with patient certain preventive protocols, quality metrics, and best practice recommendations. A written personalized care plan for preventive services as well as general preventive health recommendations were provided to patient.     Marzella Schlein, LPN   01/24/8181   Nurse Notes: none

## 2022-03-31 DIAGNOSIS — M5416 Radiculopathy, lumbar region: Secondary | ICD-10-CM | POA: Diagnosis not present

## 2022-03-31 NOTE — Progress Notes (Deleted)
Phone [phone]  Subjective:   Patient is a 66 y.o. female presenting for annual physical.    No chief complaint on file.   See problem oriented charting- ROS- full  review of systems was completed and negative except for: ***noted in HPI above.  The following were reviewed and entered/updated in epic: Past Medical History:  Diagnosis Date  . GERD (gastroesophageal reflux disease)    Several years   Patient Active Problem List   Diagnosis Date Noted  . Chronic sciatica of right side 06/19/2021   Past Surgical History:  Procedure Laterality Date  . CESAREAN SECTION  1986: 1988  . TUBAL LIGATION  1988    Family History  Problem Relation Age of Onset  . Cancer Mother   . Diabetes Mother   . Heart disease Mother   . Cancer Sister   . Varicose Veins Sister   . Cancer Sister     Medications- reviewed and updated Current Outpatient Medications  Medication Sig Dispense Refill  . pregabalin (LYRICA) 75 MG capsule Take 1 tablet 75 mg by mouth twice a day, morning and bedtime. 60 capsule 1   No current facility-administered medications for this visit.    Allergies-reviewed and updated No Known Allergies  Social History   Social History Narrative  . Not on file    Objective:  There were no vitals taken for this visit. Physical Exam Vitals and nursing note reviewed.  Constitutional:      Appearance: Normal appearance.  HENT:     Head: Normocephalic.     Right Ear: Tympanic membrane normal.     Left Ear: Tympanic membrane normal.     Nose: Nose normal.     Mouth/Throat:     Mouth: Mucous membranes are moist.  Eyes:     Pupils: Pupils are equal, round, and reactive to light.  Cardiovascular:     Rate and Rhythm: Normal rate and regular rhythm.  Pulmonary:     Effort: Pulmonary effort is normal.     Breath sounds: Normal breath sounds.  Musculoskeletal:        General: Normal range of motion.     Cervical back: Normal range of motion.   Lymphadenopathy:     Cervical: No cervical adenopathy.  Skin:    General: Skin is warm and dry.  Neurological:     Mental Status: She is alert.  Psychiatric:        Mood and Affect: Mood normal.        Behavior: Behavior normal.     Assessment and Plan   Health Maintenance counseling: 1. Anticipatory guidance: Patient counseled regarding regular dental exams q6 months, eye exams,  avoiding smoking and second hand smoke, limiting alcohol to 1 beverage per day, no illicit drugs.   2. Risk factor reduction:  Advised patient of need for regular exercise and diet rich with fruits and vegetables to reduce risk of heart attack and stroke. Exercise- ***.  Wt Readings from Last 3 Encounters:  03/26/22 136 lb (61.7 kg)  10/21/21 136 lb (61.7 kg)  08/31/21 136 lb (61.7 kg)   3. Immunizations/screenings/ancillary studies  There is no immunization history on file for this patient. Health Maintenance Due  Topic Date Due  . Hepatitis C Screening  Never done  . TETANUS/TDAP  Never done  . Lung Cancer Screening  Never done  . MAMMOGRAM  Never done  . DEXA SCAN  Never done    4. Cervical cancer screening- *** 5. Breast cancer screening-  mammogram *** 6. Colon cancer screening - *** 7. Skin cancer screening- advised regular sunscreen use. Denies worrisome, changing, or new skin lesions.  8. Birth control/STD check- *** 9. Osteoporosis screening- *** 10. Alcohol screening: *** 11. Smoking associated screening (lung cancer screening, AAA screen 65-75, UA)- *** smoker- ***ppd  Problem List Items Addressed This Visit   None Visit Diagnoses     Annual physical exam    -  Primary       Recommended follow up: ***No follow-ups on file. Future Appointments  Date Time Provider Department Center  04/01/2022 10:00 AM Dulce Sellar, NP [facility]  09/28/2022  2:40 PM Shamleffer, Konrad Dolores, MD [facility] None  04/01/2023  2:30 PM [facility]    Lab/Order  associations:fasting   Dulce Sellar, NP

## 2022-04-01 ENCOUNTER — Encounter: Payer: Medicare Other | Admitting: Family

## 2022-04-01 DIAGNOSIS — Z0000 Encounter for general adult medical examination without abnormal findings: Secondary | ICD-10-CM

## 2022-04-07 ENCOUNTER — Encounter: Payer: Self-pay | Admitting: Family

## 2022-04-07 ENCOUNTER — Ambulatory Visit (INDEPENDENT_AMBULATORY_CARE_PROVIDER_SITE_OTHER): Payer: Medicare Other | Admitting: Family

## 2022-04-07 VITALS — BP 144/73 | HR 70 | Temp 97.7°F | Ht 60.0 in | Wt 136.8 lb

## 2022-04-07 DIAGNOSIS — Z0000 Encounter for general adult medical examination without abnormal findings: Secondary | ICD-10-CM | POA: Diagnosis not present

## 2022-04-07 DIAGNOSIS — Z1239 Encounter for other screening for malignant neoplasm of breast: Secondary | ICD-10-CM

## 2022-04-07 LAB — CBC WITH DIFFERENTIAL/PLATELET
Basophils Absolute: 0 10*3/uL (ref 0.0–0.1)
Basophils Relative: 0.3 % (ref 0.0–3.0)
Eosinophils Absolute: 0.2 10*3/uL (ref 0.0–0.7)
Eosinophils Relative: 1.8 % (ref 0.0–5.0)
HCT: 38.1 % (ref 36.0–46.0)
Hemoglobin: 12.4 g/dL (ref 12.0–15.0)
Lymphocytes Relative: 26.5 % (ref 12.0–46.0)
Lymphs Abs: 2.9 10*3/uL (ref 0.7–4.0)
MCHC: 32.6 g/dL (ref 30.0–36.0)
MCV: 85.3 fl (ref 78.0–100.0)
Monocytes Absolute: 0.6 10*3/uL (ref 0.1–1.0)
Monocytes Relative: 5.7 % (ref 3.0–12.0)
Neutro Abs: 7.3 10*3/uL (ref 1.4–7.7)
Neutrophils Relative %: 65.7 % (ref 43.0–77.0)
Platelets: 289 10*3/uL (ref 150.0–400.0)
RBC: 4.47 Mil/uL (ref 3.87–5.11)
RDW: 13.6 % (ref 11.5–15.5)
WBC: 11.1 10*3/uL — ABNORMAL HIGH (ref 4.0–10.5)

## 2022-04-07 LAB — COMPREHENSIVE METABOLIC PANEL
ALT: 12 U/L (ref 0–35)
AST: 20 U/L (ref 0–37)
Albumin: 4.3 g/dL (ref 3.5–5.2)
Alkaline Phosphatase: 76 U/L (ref 39–117)
BUN: 8 mg/dL (ref 6–23)
CO2: 29 mEq/L (ref 19–32)
Calcium: 9.1 mg/dL (ref 8.4–10.5)
Chloride: 105 mEq/L (ref 96–112)
Creatinine, Ser: 0.9 mg/dL (ref 0.40–1.20)
GFR: 65.84 mL/min (ref >60.00)
Glucose, Bld: 99 mg/dL (ref 70–99)
Potassium: 4.7 mEq/L (ref 3.5–5.1)
Sodium: 140 mEq/L (ref 135–145)
Total Bilirubin: 0.8 mg/dL (ref 0.2–1.2)
Total Protein: 6.7 g/dL (ref 6.0–8.3)

## 2022-04-07 LAB — LIPID PANEL
Cholesterol: 192 mg/dL (ref 0–200)
HDL: 59.6 mg/dL (ref >39.00)
LDL Cholesterol: 109 mg/dL — ABNORMAL HIGH (ref 0–99)
NonHDL: 132.16
Total CHOL/HDL Ratio: 3
Triglycerides: 116 mg/dL (ref 0.0–149.0)
VLDL: 23.2 mg/dL (ref 0.0–40.0)

## 2022-04-07 LAB — TSH: TSH: 2.17 u[IU]/mL (ref 0.35–5.50)

## 2022-04-07 LAB — T4, FREE: Free T4: 0.96 ng/dL (ref 0.60–1.60)

## 2022-04-07 NOTE — Progress Notes (Signed)
Phone [phone]  Subjective:   Patient is a 66 y.o. female presenting for annual physical.    Chief Complaint  Patient presents with   Annual Exam    Pt had no questions or concerns      See problem oriented charting- ROS- full  review of systems was completed and negative  The following were reviewed and entered/updated in epic: Past Medical History:  Diagnosis Date   GERD (gastroesophageal reflux disease)    Several years   Patient Active Problem List   Diagnosis Date Noted   Chronic sciatica of right side 06/19/2021   Past Surgical History:  Procedure Laterality Date   CESAREAN SECTION  1986: 1988   TUBAL LIGATION  1988    Family History  Problem Relation Age of Onset   Cancer Mother    Diabetes Mother    Heart disease Mother    Cancer Sister    Varicose Veins Sister    Cancer Sister     Medications- reviewed and updated Current Outpatient Medications  Medication Sig Dispense Refill   pregabalin (LYRICA) 75 MG capsule Take 1 tablet 75 mg by mouth twice a day, morning and bedtime. 60 capsule 1   No current facility-administered medications for this visit.    Allergies-reviewed and updated No Known Allergies  Social History   Social History Narrative   Not on file    Objective:  BP (!) 144/73 (BP Location: Left Arm, Patient Position: Sitting)   Pulse 70   Temp 97.7 F (36.5 C) (Temporal)   Ht 5' (1.524 m)   Wt 136 lb 12.8 oz (62.1 kg)   SpO2 96%   BMI 26.72 kg/m  Physical Exam Vitals and nursing note reviewed.  Constitutional:      Appearance: Normal appearance.  HENT:     Head: Normocephalic.     Right Ear: Tympanic membrane normal.     Left Ear: Tympanic membrane normal.     Nose: Nose normal.     Mouth/Throat:     Mouth: Mucous membranes are moist.  Eyes:     Pupils: Pupils are equal, round, and reactive to light.  Cardiovascular:     Rate and Rhythm: Normal rate and regular rhythm.  Pulmonary:     Effort: Pulmonary effort  is normal.     Breath sounds: Normal breath sounds.  Musculoskeletal:        General: Normal range of motion.     Cervical back: Normal range of motion.  Lymphadenopathy:     Cervical: No cervical adenopathy.  Skin:    General: Skin is warm and dry.  Neurological:     Mental Status: She is alert.  Psychiatric:        Mood and Affect: Mood normal.        Behavior: Behavior normal.      Assessment and Plan   Health Maintenance counseling: 1. Anticipatory guidance: Patient counseled regarding regular dental exams q6 months, eye exams,  avoiding smoking and second hand smoke, limiting alcohol to 1 beverage per day, no illicit drugs.   2. Risk factor reduction:  Advised patient of need for regular exercise and diet rich with fruits and vegetables to reduce risk of heart attack and stroke. Exercise- walking.  Wt Readings from Last 3 Encounters:  04/07/22 136 lb 12.8 oz (62.1 kg)  03/26/22 136 lb (61.7 kg)  10/21/21 136 lb (61.7 kg)   3. Immunizations/screenings/ancillary studies  There is no immunization history on file for this patient. Health  Maintenance Due  Topic Date Due   Lung Cancer Screening  Never done   MAMMOGRAM  Never done   DEXA SCAN  Never done    4. Cervical cancer screening-  pt states she had done earlier this year  5. Breast cancer screening-  mammogram overdue, pt refuses to do -2 sisters w/breast ca, willing to do [place] 6. Colon cancer screening - reports having done about 8-9 years ago.  7. Skin cancer screening- advised regular sunscreen use. Denies worrisome, changing, or new skin lesions.  8. Birth control/STD check- N/A 9. Osteoporosis screening-  reports having done this year, borderline osteoporosis, requesting records  10. Alcohol screening: rare 11. Smoking associated screening (lung cancer screening, AAA screen 65-75, UA)- non- smoker  Problem List Items Addressed This Visit   None Visit Diagnoses     Annual physical exam    -  Primary   Relevant  Orders   Comprehensive metabolic panel   TSH   Lipid panel   CBC with Differential/Platelet   T4, free   Encounter for breast cancer screening using non-mammogram modality    - pt reports hx of very painful mammograms, would prefer [place], but I highly encouraged getting mammogram d/t family hx.   Relevant Orders   US BREAST LTD UNI LEFT INC AXILLA   US BREAST LTD UNI RIGHT INC AXILLA      Recommended follow up: Return for any future concerns. Future Appointments  Date Time Provider Department Center  09/28/2022  2:40 PM Shamleffer, Konrad Dolores, MD [facility] None  04/01/2023  2:30 PM [facility]    Lab/Order associations: fasting   Dulce Sellar, NP

## 2022-04-07 NOTE — Patient Instructions (Signed)
It was very nice to see you today!   I will review your lab results via MyChart in a few days.   Have a wonderful holiday season!       PLEASE NOTE:  If you had any lab tests please let us know if you have not heard back within a few days. You may see your results on MyChart before we have a chance to review them but we will give you a call once they are reviewed by [place]. If we ordered any referrals today, please let us know if you have not heard from their office within the next week.

## 2022-04-12 NOTE — Progress Notes (Signed)
Your glucose, electrolytes, blood count (white cells slightly high, not worrisome), thyroid, liver & kidney function are all normal.  Your cholesterol numbers are all good, just your LDL (bad #) is slightly high. Try to reduce saturated fat in your diet, e.g. fatty red meat, fried foods, high fat dairy foods:  including cheese, milk, ice cream.   Continue or restart an exercise routine, shooting for 5-7days per week, and remember to drink at least 2 liters = 8 cups of water every day!

## 2022-05-24 DIAGNOSIS — G20A1 Parkinson's disease without dyskinesia, without mention of fluctuations: Secondary | ICD-10-CM

## 2022-05-24 HISTORY — DX: Parkinson's disease without dyskinesia, without mention of fluctuations: G20.A1

## 2022-06-20 ENCOUNTER — Other Ambulatory Visit: Payer: Self-pay | Admitting: Physical Medicine and Rehabilitation

## 2022-09-08 ENCOUNTER — Ambulatory Visit: Payer: Medicare Other | Admitting: Family

## 2022-09-28 ENCOUNTER — Encounter: Payer: Self-pay | Admitting: Internal Medicine

## 2022-09-28 ENCOUNTER — Ambulatory Visit: Payer: Medicare Other | Admitting: Internal Medicine

## 2022-09-28 VITALS — BP 130/74 | HR 66 | Ht 60.0 in | Wt 132.0 lb

## 2022-09-28 DIAGNOSIS — M810 Age-related osteoporosis without current pathological fracture: Secondary | ICD-10-CM

## 2022-09-28 MED ORDER — ALENDRONATE SODIUM 70 MG PO TABS: 70.0000 mg | ORAL_TABLET | ORAL | 3 refills | Status: DC

## 2022-09-28 NOTE — Patient Instructions (Signed)
Please take Calcium 600 mg twice daily  Continue Vitamin D daily

## 2022-09-28 NOTE — Progress Notes (Unsigned)
Name: Janet Solomon  MRN/ DOB: [id number], [birth year]    Age/ Sex: 67 y.o., female    PCP: Dulce Sellar, NP   Reason for Endocrinology Evaluation: Osteoporosis     Date of Initial Endocrinology Evaluation: 09/28/2022     HPI: [name] is a 67 y.o. female with a past medical history of osteoporosis, chronic back pain. The patient presented for initial endocrinology clinic visit on 09/28/2022 for consultative assistance with her osteoporosis.     Patient was referred by [facility] specialist for further evaluation of osteoporosis.  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     Pt was diagnosed with osteoporosis:  Menarche at age : 66 Menopausal at age : ~80  Fracture Hx: self diagnosis of toe fractures  in her 34's  Hx of HRT: no FH of osteoporosis or hip fracture: no Prior Hx of anti-estrogenic therapy : n/a Prior Hx of anti-resorptive therapy : n/a  She is not on calcium  She on vitamin D and K2 Vitamin B12  Magnesium  Does not smoke  Does not drink alcohol  Denies heartburn Has occasional constipation or diarrhea  Has left arm shaking , that she questions if related to lyrica   Sister with Parkins' disease   Pt with transportation issues      HISTORY:  Past Medical History:  Past Medical History:  Diagnosis Date   GERD (gastroesophageal reflux disease)    Several years   Past Surgical History:  Past Surgical History:  Procedure Laterality Date   CESAREAN SECTION  1986: 1988   TUBAL LIGATION  1988    Social History:  reports that she quit smoking about 5 years ago. Her smoking use included cigarettes. She has a 22.50 pack-year smoking history. She has never used smokeless tobacco. She reports that she does not use drugs. Family History: family history includes Cancer in her mother, sister, and sister; Diabetes in her mother;  Heart disease in her mother; Varicose Veins in her sister.   HOME MEDICATIONS: Allergies as of 09/28/2022   No Known Allergies      Medication List        Accurate as of Sep 28, 2022  2:45 PM. If you have any questions, ask your nurse or doctor.          MAG GLYCINATE PO Take by mouth.   pregabalin 75 MG capsule Commonly known as: LYRICA TAKE 1 TABLET 75 MG BY MOUTH TWICE A DAY, MORNING AND BEDTIME.          REVIEW OF SYSTEMS: A comprehensive ROS was conducted with the patient and is negative except as per HPI    OBJECTIVE:  VS: BP 130/74 (BP Location: Left Arm, Patient Position: Sitting, Cuff Size: Small)   Pulse 66   Ht 5' (1.524 m)   Wt 132 lb (59.9 kg)   SpO2 95%   BMI 25.78 kg/m    Wt Readings from Last 3 Encounters:  09/28/22 132 lb (59.9 kg)  04/07/22 136 lb 12.8 oz (62.1 kg)  03/26/22 136 lb (61.7 kg)     EXAM: General: Pt appears well and is in NAD  Eyes: External eye exam normal without stare, lid lag or exophthalmos.  EOM intact.   Neck: General: Supple without adenopathy. Thyroid: Thyroid size normal.  No goiter or nodules appreciated.  Lungs: Clear with good BS bilat   Heart: Auscultation: RRR.  Abdomen:  Soft, nontender  Extremities:  BL LE: No pretibial edema   Mental Status: Judgment, insight: Intact Orientation: Oriented to time, place, and person Mood and affect: No depression, anxiety, or agitation     DATA REVIEWED: ***    DXA @ [facility] 12/10/2021    T-score  AP -2.0  RFN -2.5  Right TH -1.5  LFN +2.0  Left TH  -1.2  L 1/3 distal radius -1.7   Old records , labs and images have been reviewed.    ASSESSMENT/PLAN/RECOMMENDATIONS:   Osteoporosis:  -Emphasized the importance of optimizing calcium and vitamin D intake -Patient is limited on exercise due to chronic back pain -Given that her osteoporosis is early with a T-score of -2.5 at the right femoral neck, I have recommended bisphosphonate therapy, cautioned  against heartburn -We also discussed the increased risk of osteonecrosis as well as atypical thigh fractures with prolonged use -Discussed taking a drug holiday and around the fifth year mark -Patient with transportation issues, which is another reason to avoid injectables in the office at this time  Medications : Start calcium 600 mg twice daily Start alendronate 70 mg weekly    Follow-up in 6 months Signed electronically by: Lyndle Herrlich, MD  [facility]  [facility] [address] [place], [place] [address] Phone: [phone] FAX: [phone]   CC: Dulce Sellar, NP [address] [place] [address] Phone: [phone] Fax: [phone]   Return to Endocrinology clinic as below: Future Appointments  Date Time Provider Department Center  04/01/2023  2:30 PM [facility] ANNUAL WELLNESS VISIT 1 [facility]

## 2022-09-29 LAB — T4, FREE: Free T4: 0.93 ng/dL (ref 0.60–1.60)

## 2022-09-29 LAB — BASIC METABOLIC PANEL
BUN: 11 mg/dL (ref 6–23)
CO2: 24 mEq/L (ref 19–32)
Calcium: 9.2 mg/dL (ref 8.4–10.5)
Chloride: 105 mEq/L (ref 96–112)
Creatinine, Ser: 0.96 mg/dL (ref 0.40–1.20)
GFR: 60.73 mL/min (ref >60.00)
Glucose, Bld: 88 mg/dL (ref 70–99)
Potassium: 4.3 mEq/L (ref 3.5–5.1)
Sodium: 140 mEq/L (ref 135–145)

## 2022-09-29 LAB — PARATHYROID HORMONE, INTACT (NO CA): PTH: 79 pg/mL — ABNORMAL HIGH (ref 16–77)

## 2022-09-29 LAB — TSH: TSH: 2.57 u[IU]/mL (ref 0.35–5.50)

## 2022-09-29 LAB — VITAMIN D 25 HYDROXY (VIT D DEFICIENCY, FRACTURES): VITD: 24.04 ng/mL — ABNORMAL LOW (ref 30.00–100.00)

## 2022-10-18 ENCOUNTER — Other Ambulatory Visit: Payer: Self-pay | Admitting: Physical Medicine and Rehabilitation

## 2022-12-26 ENCOUNTER — Ambulatory Visit
Admission: RE | Admit: 2022-12-26 | Discharge: 2022-12-26 | Disposition: A | Discharge status: Home / Self Care | Payer: Medicare Other | Source: Ambulatory Visit | Attending: Urgent Care | Admitting: Urgent Care

## 2022-12-26 ENCOUNTER — Ambulatory Visit (INDEPENDENT_AMBULATORY_CARE_PROVIDER_SITE_OTHER): Payer: Medicare Other

## 2022-12-26 VITALS — BP 155/79 | HR 59 | Temp 98.6°F | Resp 18

## 2022-12-26 DIAGNOSIS — S92352A Displaced fracture of fifth metatarsal bone, left foot, initial encounter for closed fracture: Secondary | ICD-10-CM | POA: Diagnosis not present

## 2022-12-26 DIAGNOSIS — S9032XA Contusion of left foot, initial encounter: Secondary | ICD-10-CM | POA: Diagnosis not present

## 2022-12-26 MED ORDER — IBUPROFEN 800 MG PO TABS: 800.0000 mg | ORAL_TABLET | Freq: Three times a day (TID) | ORAL | 0 refills | Status: DC

## 2022-12-26 MED ORDER — HYDROCODONE-ACETAMINOPHEN 5-325 MG PO TABS: 1.0000 | ORAL_TABLET | Freq: Three times a day (TID) | ORAL | 0 refills | Status: DC | PRN

## 2022-12-26 NOTE — Discharge Instructions (Addendum)
You have a 5th metatarsal fracture. Please wear the boot until instructed otherwise by orthopedics. Please use the crutches and avoid bearing weight until instructed otherwise.  Take the ibuprofen every 8 hours as needed for mild to moderate pain.  Take this with food.  For more severe pain, you can take the hydrocodone.  Keep in mind this medication may make you feel drowsy so take with caution and do not operate machinery or drive a car after taking.  Call ortho tomorrow to schedule a follow up exam.

## 2022-12-26 NOTE — ED Notes (Signed)
Pt refused crutches. PA notified.

## 2022-12-26 NOTE — ED Provider Notes (Signed)
[facility]    CSN: [id number] Arrival date & time: 12/26/22  1003      History   Chief Complaint Chief Complaint  Patient presents with   Foot Pain    HPI Janet Solomon is a 67 y.o. female.   Pleasant 67 year old female presents today due to concerns of left foot pain.  She states 3 to 4 days ago she hopped off the bed, and is not certain if she injured the foot or not.  Does not recall any particular event, but reports discomfort with weightbearing, swelling, and bruising to the dorsal left foot between the metatarsals and digits #2 through 5.  She states at rest the pain is a 6 out of 10, increases to a 7 or 8 out of 10 with use.  She has not been taking any over-the-counter medications for her pain.  She has been using an Ace wrap and states that the compression has helped. Does have hx of osteopenia for which she takes fosamax.   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      Home Medications    Prior to Admission medications   Medication Sig Start Date End Date Taking? Authorizing Provider  HYDROcodone-acetaminophen (NORCO/VICODIN) 5-325 MG tablet Take 1 tablet by mouth every 8 (eight) hours as needed for severe pain. 12/26/22  Yes Fredric Slabach L, PA  ibuprofen (ADVIL) 800 MG tablet Take 1 tablet (800 mg total) by mouth 3 (three) times daily. Take with food 12/26/22  Yes Lorenso Quirino L, PA  alendronate (FOSAMAX) 70 MG tablet Take 1 tablet (70 mg total) by mouth every 7 (seven) days. Take with a full glass of water on an empty stomach. 09/28/22   Shamleffer, Konrad Dolores, MD  Magnesium Bisglycinate (MAG GLYCINATE PO) Take  by mouth.    [provider]  pregabalin (LYRICA) 75 MG capsule TAKE 1 TABLET 75 MG BY MOUTH TWICE A DAY, MORNING AND BEDTIME. 10/19/22   Juanda Chance, NP    Family History Family History  Problem Relation Age of Onset   Cancer Mother    Diabetes Mother    Heart disease Mother    Cancer Sister    Varicose Veins Sister    Cancer Sister     Social History Social History   Tobacco Use   Smoking status: Former    Current packs/day: 0.00    Average packs/day: 1.5 packs/day for 15.0 years (22.5 ttl pk-yrs)    Types: Cigarettes    Start date: 07/25/2002    Quit date: 07/24/2017    Years since quitting: 5.4   Smokeless tobacco: Never   Tobacco comments:    Quit!  Substance Use Topics   Drug use: Never     Allergies   Patient has no known allergies.   Review of Systems Review of Systems As per HPI  Physical Exam Triage Vital Signs ED Triage Vitals  Encounter Vitals Group     BP 12/26/22 1009 (!) 155/79     Systolic BP Percentile --      Diastolic BP Percentile --  Pulse Rate 12/26/22 1009 (!) 59     Resp 12/26/22 1009 18     Temp 12/26/22 1009 98.6 F (37 C)     Temp Source 12/26/22 1009 Oral     SpO2 12/26/22 1009 96 %     Weight --      Height --      Head Circumference --      Peak Flow --      Pain Score 12/26/22 1014 7     Pain Loc --      Pain Education --      Exclude from Growth Chart --    No data found.  Updated Vital Signs BP (!) 155/79 (BP Location: Right Arm)   Pulse (!) 59   Temp 98.6 F (37 C) (Oral)   Resp 18   SpO2 96%   Visual Acuity Right Eye Distance:   Left Eye Distance:   Bilateral Distance:    Right Eye Near:   Left Eye Near:    Bilateral Near:     Physical Exam Vitals and nursing note reviewed. Exam conducted with a chaperone present.  Constitutional:      General: She is not in acute distress.    Appearance: Normal appearance. She is normal weight. She is not ill-appearing, toxic-appearing or  diaphoretic.  HENT:     Head: Normocephalic and atraumatic.  Pulmonary:     Effort: Pulmonary effort is normal. No respiratory distress.  Musculoskeletal:     Left lower leg: Normal. No swelling.     Left ankle: Normal. No swelling, deformity, ecchymosis or lacerations. No tenderness. Normal range of motion. Anterior drawer test negative. Normal pulse.     Left Achilles Tendon: Normal. No tenderness. Thompson's test negative.     Left foot: Normal range of motion. Tenderness and bony tenderness present. Normal pulse.       Feet:  Feet:     Right foot:     Skin integrity: Skin integrity normal.     Left foot:     Skin integrity: Skin integrity normal. No ulcer, blister, skin breakdown, erythema or warmth.     Toenail Condition: Left toenails are abnormally thick.  Neurological:     Mental Status: She is alert.      UC Treatments / Results  Labs (all labs ordered are listed, but only abnormal results are displayed) Labs Reviewed - No data to display  EKG   Radiology DG Foot Complete Left  Result Date: 12/26/2022 CLINICAL DATA:  pain and bruising to toes and metatarsals #2-5, inability to weight bear EXAM: LEFT FOOT - COMPLETE 3+ VIEW COMPARISON:  None Available. FINDINGS: Oblique fracture across the distal shaft of the fifth metatarsal, with 1-2 mm medial displacement of distal fracture fragment. No extension to the subchondral cortex. No significant angulation deformity. Otherwise normal mineralization and alignment. No significant osseous degenerative change. Regional soft tissues unremarkable. IMPRESSION: Minimally displaced fracture of the distal fifth metatarsal. Electronically Signed   By: Corlis Leak M.D.   On: 12/26/2022 10:51    Procedures Procedures (including critical care time)  Medications Ordered in UC Medications - No data to display  Initial Impression / Assessment and Plan / UC Course  I have reviewed the triage vital signs and the nursing notes.  Pertinent  labs & imaging results that were available during my care of the patient were reviewed by me and considered in my medical decision making (see chart for details).     Displaced 5th  metatarsal fracture - pt placed in boot and instructed to rest and elevate. Will give advil for mild/moderate pain Q 8 hours, norco for more severe pain. Pt understands side effects to this medication (drowsiness, constipation, etc). Pt has an orthopedist and will call them tomorrow to schedule follow up exam.   Final Clinical Impressions(s) / UC Diagnoses   Final diagnoses:  Closed displaced fracture of fifth metatarsal bone of left foot, initial encounter     Discharge Instructions      You have a 5th metatarsal fracture. Please wear the boot until instructed otherwise by orthopedics. Please use the crutches and avoid bearing weight until instructed otherwise.  Take the ibuprofen every 8 hours as needed for mild to moderate pain.  Take this with food.  For more severe pain, you can take the hydrocodone.  Keep in mind this medication may make you feel drowsy so take with caution and do not operate machinery or drive a car after taking.  Call ortho tomorrow to schedule a follow up exam.     ED Prescriptions     Medication Sig Dispense Auth. Provider   HYDROcodone-acetaminophen (NORCO/VICODIN) 5-325 MG tablet Take 1 tablet by mouth every 8 (eight) hours as needed for severe pain. 12 tablet Kenyan Karnes L, PA   ibuprofen (ADVIL) 800 MG tablet Take 1 tablet (800 mg total) by mouth 3 (three) times daily. Take with food 21 tablet Deunta Beneke L, PA      I have reviewed the PDMP during this encounter.   Maretta Bees, [place] 12/26/22 1906

## 2022-12-26 NOTE — ED Triage Notes (Signed)
Pt reports pain and swelling on left foot x 3-4 days. Pt can't remember how she injured the left foot.

## 2023-01-04 ENCOUNTER — Encounter: Payer: Self-pay | Admitting: Orthopedic Surgery

## 2023-01-04 ENCOUNTER — Ambulatory Visit: Payer: Medicare Other | Admitting: Orthopedic Surgery

## 2023-01-04 DIAGNOSIS — S92352A Displaced fracture of fifth metatarsal bone, left foot, initial encounter for closed fracture: Secondary | ICD-10-CM | POA: Diagnosis not present

## 2023-01-05 ENCOUNTER — Encounter: Payer: Self-pay | Admitting: Orthopedic Surgery

## 2023-01-05 NOTE — Progress Notes (Signed)
Office Visit Note   Patient: Janet Solomon           Date of Birth: 12-May-1956           MRN: [id number] Visit Date: 01/04/2023              Requested by: Dulce Sellar, NP [address] [place],  [place] [address] PCP: Dulce Sellar, NP  Chief Complaint  Patient presents with   Left Foot - Follow-up    ER 12/26/2022 5th MT fx       HPI: Patient is a 67 year old woman who is seen for initial evaluation for left foot fifth metatarsal neck fracture.  Patient states she hopped off the bed 3 to 4 days ago causing pain with weightbearing and swelling.  Assessment & Plan: Visit Diagnoses:  1. Closed displaced fracture of fifth metatarsal bone of left foot, initial encounter     Plan: Patient will continue weightbearing as tolerated in the fracture boot.  Recommended elevation to decrease swelling follow-up in 4 weeks with three-view radiographs of the left foot.  Follow-Up Instructions: Return in about 4 weeks (around 02/01/2023).   Ortho Exam  Patient is alert, oriented, no adenopathy, well-dressed, normal affect, normal respiratory effort. Examination patient has a good pulse there is bruising in the forefoot that extends into the fourth toe.  Review of the radiographs shows a minimally displaced fifth metatarsal neck fracture without malalignment.  Imaging: No results found. No images are attached to the encounter.  Labs: No results found for: "HGBA1C", "ESRSEDRATE", "CRP", "LABURIC", "REPTSTATUS", "GRAMSTAIN", "CULT", "LABORGA"   Lab Results  Component Value Date   ALBUMIN 4.3 04/07/2022    No results found for: "MG" Lab Results  Component Value Date   VD25OH 24.04 (L) 09/28/2022    No results found for: "PREALBUMIN"    Latest Ref Rng & Units 04/07/2022   10:39 AM  CBC EXTENDED  WBC 4.0 - 10.5 K/uL 11.1   RBC 3.87 - 5.11 Mil/uL 4.47   Hemoglobin 12.0 - 15.0 g/dL 66.4   HCT 40.3 - 47.4 % 38.1   Platelets 150.0 - 400.0 K/uL 289.0    NEUT# 1.4 - 7.7 K/uL 7.3   Lymph# 0.7 - 4.0 K/uL 2.9      There is no height or weight on file to calculate BMI.  Orders:  No orders of the defined types were placed in this encounter.  No orders of the defined types were placed in this encounter.    Procedures: No procedures performed  Clinical Data: No additional findings.  ROS:  All other systems negative, except as noted in the HPI. Review of Systems  Objective: Vital Signs: There were no vitals taken for this visit.  Specialty Comments:  MRI LUMBAR SPINE WITHOUT CONTRAST   TECHNIQUE: Multiplanar, multisequence MR imaging of the lumbar spine was performed. No intravenous contrast was administered.   COMPARISON:  Radiograph from 06/07/2021.   FINDINGS: Segmentation: Standard. Lowest well-formed disc space labeled the L5-S1 level.   Alignment: Moderate dextroscoliosis with apex at L2. Chronic bilateral pars defects at L5 with associated 12 mm spondylolisthesis. Trace stepwise retrolisthesis with exaggeration of the normal lumbar lordosis seen elsewhere within the lumbar spine.   Vertebrae: Vertebral body height maintained without acute or chronic fracture. Bone marrow signal intensity diffusely heterogeneous with a few scattered benign hemangiomata present. No worrisome osseous lesions. No abnormal marrow edema.   Conus medullaris and cauda equina: Conus extends to the L1 level. Conus and cauda  equina appear normal.   Paraspinal and other soft tissues: Paraspinous soft tissues demonstrate no acute finding. T2 hyperintense cystic lesion measuring up to approximately 5.4 cm seen within the right adnexa, partially visualized, and indeterminate (series 8, image 13). Remainder of the visualized visceral structures grossly unremarkable.   Disc levels:   T11-12: Diffuse disc bulge, asymmetric to the right. Right greater than left facet hypertrophy. Resultant mild canal stenosis. Moderate right foraminal  narrowing. Left neural foramina remains patent.   T12-L1: Diffuse disc bulge with disc desiccation. Reactive endplate spurring. Mild left greater than right facet hypertrophy. Resultant mild narrowing of the left lateral recess. Central canal remains patent. Mild left foraminal narrowing. Right neural foramina remains patent.   L1-2: Degenerative intervertebral disc space narrowing with diffuse disc bulge and disc desiccation. Reactive endplate spurring. Mild facet hypertrophy. No significant spinal stenosis. Foramina remain grossly patent.   L2-3: Degenerative intervertebral disc space narrowing with diffuse disc bulge and disc desiccation. Reactive endplate spurring. Moderate right worse than left facet hypertrophy. Mild narrowing of the left lateral recess without significant spinal stenosis. Mild left L2 foraminal narrowing. Right neural foramen remains patent.   L3-4: Mild intervertebral disc space narrowing with diffuse disc bulge and disc desiccation. Moderate right with mild left facet hypertrophy. No significant spinal stenosis. Moderate right worse than left L3 foraminal stenosis.   L4-5: Disc desiccation. Small central disc protrusion indents the ventral thecal sac (series 13, image 31). Moderate right worse than left facet hypertrophy. Mild narrowing of the lateral recesses, slightly worse on the left. Central canal remains patent. Moderate right with mild to moderate left L4 foraminal stenosis.   L5-S1: Chronic bilateral pars defects with 12 mm spondylolisthesis. Advanced intervertebral disc space narrowing with broad posterior pseudo disc bulge/uncovering. Moderate bilateral facet hypertrophy. No significant spinal stenosis. Severe right worse than left L5 foraminal narrowing.   IMPRESSION: 1. 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 2. Underlying moderate  dextroscoliosis with additional moderate multilevel spondylosis at L1-2 through L4-5 as above. No other high-grade spinal stenosis. Moderate bilateral L3 and L4 foraminal narrowing. 3. 5.4 cm T2 hyperintense cystic lesion within the right adnexa, partially visualized, and not fully characterized on this exam. Follow-up examination with dedicated pelvic ultrasound recommended for further evaluation.     Electronically Signed   By: Rise Mu M.D.   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   Past Medical History:  Diagnosis Date   GERD (gastroesophageal reflux disease)    Several years    Family History  Problem Relation Age of Onset   Cancer Mother    Diabetes Mother    Heart disease Mother    Cancer Sister    Varicose Veins Sister    Cancer Sister     Past Surgical History:  Procedure Laterality Date   CESAREAN SECTION  1986: 1988   TUBAL LIGATION  1988   Social History   Occupational History   Not on file  Tobacco Use   Smoking status: Former    Current packs/day: 0.00    Average packs/day: 1.5 packs/day for 15.0 years (22.5 ttl pk-yrs)    Types: Cigarettes    Start date: 07/25/2002    Quit date: 07/24/2017    Years since quitting: 5.4   Smokeless tobacco: Never   Tobacco comments:  Quit!  Substance and Sexual Activity   Alcohol use: Not on file   Drug use: Never   Sexual activity: Not Currently

## 2023-01-25 ENCOUNTER — Other Ambulatory Visit: Payer: Self-pay | Admitting: Physical Medicine and Rehabilitation

## 2023-02-01 ENCOUNTER — Ambulatory Visit: Payer: Medicare Other | Admitting: Orthopedic Surgery

## 2023-02-01 ENCOUNTER — Other Ambulatory Visit (INDEPENDENT_AMBULATORY_CARE_PROVIDER_SITE_OTHER): Payer: Medicare Other

## 2023-02-01 DIAGNOSIS — S92352A Displaced fracture of fifth metatarsal bone, left foot, initial encounter for closed fracture: Secondary | ICD-10-CM

## 2023-02-02 ENCOUNTER — Encounter: Payer: Self-pay | Admitting: Orthopedic Surgery

## 2023-02-02 NOTE — Progress Notes (Signed)
Office Visit Note   Patient: Janet Solomon           Date of Birth: 31-Oct-1955           MRN: [id number] Visit Date: 02/01/2023              Requested by: Dulce Sellar, NP [address] [place],  [place] [address] PCP: Dulce Sellar, NP  Chief Complaint  Patient presents with   Left Foot - Follow-up     ER 12/26/2022 5th MT fx         HPI: Patient is a 67 year old woman who presents status post left fifth metatarsal neck fracture.  She has been in a fracture boot.  Patient states she has walked short distances without the boot without problems.  Assessment & Plan: Visit Diagnoses:  1. Closed displaced fracture of fifth metatarsal bone of left foot, initial encounter     Plan: Patient may advance to regular shoewear recommend a stiff soled sneaker.  Follow-Up Instructions: No follow-ups on file.   Ortho Exam  Patient is alert, oriented, no adenopathy, well-dressed, normal affect, normal respiratory effort. Examination there is no redness or cellulitis no open ulcers.  The fracture site is not tender to palpation.  Imaging: No results found. No images are attached to the encounter.  Labs: No results found for: "HGBA1C", "ESRSEDRATE", "CRP", "LABURIC", "REPTSTATUS", "GRAMSTAIN", "CULT", "LABORGA"   Lab Results  Component Value Date   ALBUMIN 4.3 04/07/2022    No results found for: "MG" Lab Results  Component Value Date   VD25OH 24.04 (L) 09/28/2022    No results found for: "PREALBUMIN"    Latest Ref Rng & Units 04/07/2022   10:39 AM  CBC EXTENDED  WBC 4.0 - 10.5 K/uL 11.1   RBC 3.87 - 5.11 Mil/uL 4.47   Hemoglobin 12.0 - 15.0 g/dL 51.8   HCT 84.1 - 66.0 % 38.1   Platelets 150.0 - 400.0 K/uL 289.0   NEUT# 1.4 - 7.7 K/uL 7.3   Lymph# 0.7 - 4.0 K/uL 2.9      There is no height or weight on file to calculate BMI.  Orders:  Orders Placed This Encounter  Procedures   XR Foot Complete Left   No orders of the defined types  were placed in this encounter.    Procedures: No procedures performed  Clinical Data: No additional findings.  ROS:  All other systems negative, except as noted in the HPI. Review of Systems  Objective: Vital Signs: There were no vitals taken for this visit.  Specialty Comments:  MRI LUMBAR SPINE WITHOUT CONTRAST   TECHNIQUE: Multiplanar, multisequence MR imaging of the lumbar spine was performed. No intravenous contrast was administered.   COMPARISON:  Radiograph from 06/07/2021.   FINDINGS: Segmentation: Standard. Lowest well-formed disc space labeled the L5-S1 level.   Alignment: Moderate dextroscoliosis with apex at L2. Chronic bilateral pars defects at L5 with associated 12 mm spondylolisthesis. Trace stepwise retrolisthesis with exaggeration of the normal lumbar lordosis seen elsewhere within the lumbar spine.   Vertebrae: Vertebral body height maintained without acute or chronic fracture. Bone marrow signal intensity diffusely heterogeneous with a few scattered benign hemangiomata present. No worrisome osseous lesions. No abnormal marrow edema.   Conus medullaris and cauda equina: Conus extends to the L1 level. Conus and cauda equina appear normal.   Paraspinal and other soft tissues: Paraspinous soft tissues demonstrate no acute finding. T2 hyperintense cystic lesion measuring up to approximately 5.4 cm seen within the  right adnexa, partially visualized, and indeterminate (series 8, image 13). Remainder of the visualized visceral structures grossly unremarkable.   Disc levels:   T11-12: Diffuse disc bulge, asymmetric to the right. Right greater than left facet hypertrophy. Resultant mild canal stenosis. Moderate right foraminal narrowing. Left neural foramina remains patent.   T12-L1: Diffuse disc bulge with disc desiccation. Reactive endplate spurring. Mild left greater than right facet hypertrophy. Resultant mild narrowing of the left lateral  recess. Central canal remains patent. Mild left foraminal narrowing. Right neural foramina remains patent.   L1-2: Degenerative intervertebral disc space narrowing with diffuse disc bulge and disc desiccation. Reactive endplate spurring. Mild facet hypertrophy. No significant spinal stenosis. Foramina remain grossly patent.   L2-3: Degenerative intervertebral disc space narrowing with diffuse disc bulge and disc desiccation. Reactive endplate spurring. Moderate right worse than left facet hypertrophy. Mild narrowing of the left lateral recess without significant spinal stenosis. Mild left L2 foraminal narrowing. Right neural foramen remains patent.   L3-4: Mild intervertebral disc space narrowing with diffuse disc bulge and disc desiccation. Moderate right with mild left facet hypertrophy. No significant spinal stenosis. Moderate right worse than left L3 foraminal stenosis.   L4-5: Disc desiccation. Small central disc protrusion indents the ventral thecal sac (series 13, image 31). Moderate right worse than left facet hypertrophy. Mild narrowing of the lateral recesses, slightly worse on the left. Central canal remains patent. Moderate right with mild to moderate left L4 foraminal stenosis.   L5-S1: Chronic bilateral pars defects with 12 mm spondylolisthesis. Advanced intervertebral disc space narrowing with broad posterior pseudo disc bulge/uncovering. Moderate bilateral facet hypertrophy. No significant spinal stenosis. Severe right worse than left L5 foraminal narrowing.   IMPRESSION: 1. 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 2. Underlying moderate dextroscoliosis with additional moderate multilevel spondylosis at L1-2 through L4-5 as above. No other high-grade spinal stenosis. Moderate bilateral L3 and L4 foraminal narrowing. 3. 5.4 cm T2 hyperintense cystic lesion  within the right adnexa, partially visualized, and not fully characterized on this exam. Follow-up examination with dedicated pelvic ultrasound recommended for further evaluation.     Electronically Signed   By: Rise Mu M.D.   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   Past Medical History:  Diagnosis Date   GERD (gastroesophageal reflux disease)    Several years    Family History  Problem Relation Age of Onset   Cancer Mother    Diabetes Mother    Heart disease Mother    Cancer Sister    Varicose Veins Sister    Cancer Sister     Past Surgical History:  Procedure Laterality Date   CESAREAN SECTION  1986: 1988   TUBAL LIGATION  1988   Social History   Occupational History   Not on file  Tobacco Use   Smoking status: Former    Current packs/day: 0.00    Average packs/day: 1.5 packs/day for 15.0 years (22.5 ttl pk-yrs)    Types: Cigarettes    Start date: 07/25/2002    Quit date: 07/24/2017    Years since quitting: 5.5   Smokeless tobacco: Never   Tobacco comments:    Quit!  Substance and Sexual Activity   Alcohol use: Not on file   Drug use: Never   Sexual activity: Not Currently

## 2023-03-17 ENCOUNTER — Encounter: Payer: Self-pay | Admitting: Family

## 2023-03-17 ENCOUNTER — Ambulatory Visit (INDEPENDENT_AMBULATORY_CARE_PROVIDER_SITE_OTHER): Payer: Medicare Other | Admitting: Family

## 2023-03-17 VITALS — BP 132/82 | HR 72 | Temp 97.8°F | Ht 60.0 in | Wt 135.2 lb

## 2023-03-17 DIAGNOSIS — M5431 Sciatica, right side: Secondary | ICD-10-CM

## 2023-03-17 DIAGNOSIS — R251 Tremor, unspecified: Secondary | ICD-10-CM

## 2023-03-17 DIAGNOSIS — M5416 Radiculopathy, lumbar region: Secondary | ICD-10-CM

## 2023-03-17 MED ORDER — METHOCARBAMOL 500 MG PO TABS: 500.0000 mg | ORAL_TABLET | Freq: Three times a day (TID) | ORAL | 0 refills | Status: DC | PRN

## 2023-03-17 MED ORDER — PREDNISONE 20 MG PO TABS: ORAL_TABLET | ORAL | 0 refills | Status: DC

## 2023-03-17 NOTE — Progress Notes (Signed)
Patient ID: Janet Solomon, female    DOB: 1956/04/30, 67 y.o.   MRN: [id number]  Chief Complaint  Patient presents with   Back Pain    Pt c/o of lower back pain that radiates down her leg.    Gastroesophageal Reflux   *Discussed the use of AI scribe software for clinical note transcription with the patient, who gave verbal consent to proceed.  History of Present Illness   The patient, with a history of lower back pain and sciatica, presents with worsening pain that starts in the lower back and radiates down the right leg. The pain has been increasing in intensity, making it difficult for the patient to lift her right leg. The patient has previously received injections for the pain, but the relief was temporary and accompanied by side effects such as dizziness and leg heaviness. The patient also reports a new symptom of a tremor in the left hand and side of the body, which is noticeable even to the patient's grandchildren. The tremor is accompanied by a tingling sensation. The patient also has a history of foot injury, which has caused imbalance and increased weight on the right leg. The patient is currently on Fosamax for bone health.      Assessment & Plan:     Sciatica - Increasing right-sided lower back pain radiating down the leg, worsening with weight bearing. Previous treatment with back injections provided minimal relief, pt would like new referral. -Start Prednisone 40mg  x3d, 20mg  x2d  tomorrow morning to reduce inflammation. -Prescribe Methocarbamol (Robaxin) for muscle relaxation, start with one pill and increase to two if needed and well-tolerated. -Encourage daily stretching exercises, provided handout for guidance. -Sending new referral to [facility] for consult on chronic pain.  Essential Tremor - New onset of left-sided tremor, more noticeable in the hand but patient reports feeling it throughout the left side. No associated numbness or tingling. -Refer  to Neurology for further evaluation and management.      Subjective:    Outpatient Medications Prior to Visit  Medication Sig Dispense Refill   alendronate (FOSAMAX) 70 MG tablet Take 1 tablet (70 mg total) by mouth every 7 (seven) days. Take with a full glass of water on an empty stomach. 12 tablet 3   HYDROcodone-acetaminophen (NORCO/VICODIN) 5-325 MG tablet Take 1 tablet by mouth every 8 (eight) hours as needed for severe pain. 12 tablet 0   ibuprofen (ADVIL) 800 MG tablet Take 1 tablet (800 mg total) by mouth 3 (three) times daily. Take with food 21 tablet 0   Magnesium Bisglycinate (MAG GLYCINATE PO) Take by mouth.     pregabalin (LYRICA) 75 MG capsule TAKE 1 TABLET 75 MG BY MOUTH TWICE A DAY, MORNING AND BEDTIME. 60 capsule 1   No facility-administered medications prior to visit.   Past Medical History:  Diagnosis Date   GERD (gastroesophageal reflux disease)    Several years   Past Surgical History:  Procedure Laterality Date   CESAREAN SECTION  1986: 1988   TUBAL LIGATION  1988   No Known Allergies    Objective:    Physical Exam Vitals and nursing note reviewed.  Constitutional:      Appearance: Normal appearance.  Cardiovascular:     Rate and Rhythm: Normal rate and regular rhythm.  Pulmonary:     Effort: Pulmonary effort is normal.     Breath sounds: Normal breath sounds.  Musculoskeletal:        General: Normal range of motion.  Skin:    General: Skin is warm and dry.  Neurological:     Mental Status: She is alert.     Motor: Tremor (left hand) present.  Psychiatric:        Mood and Affect: Mood normal.        Behavior: Behavior normal.    BP 132/82   Pulse 72   Temp 97.8 F (36.6 C)   Ht 5' (1.524 m)   Wt 135 lb 3.2 oz (61.3 kg)   SpO2 99%   BMI 26.40 kg/m  Wt Readings from Last 3 Encounters:  03/17/23 135 lb 3.2 oz (61.3 kg)  09/28/22 132 lb (59.9 kg)  04/07/22 136 lb 12.8 oz (62.1 kg)      Dulce Sellar, NP

## 2023-03-17 NOTE — Patient Instructions (Addendum)
It was very nice to see you today!  YOUR PLAN:  -SCIATICA: Sciatica is pain that radiates along the path of the sciatic nerve, which runs from your lower back through your hips and buttocks and down each leg. To help reduce the inflammation causing your pain, you will start taking Prednisone tomorrow morning for 5 days. Additionally, you are prescribed Methocarbamol (Robaxin) for muscle relaxation; start with one pill and increase to two if needed and well-tolerated. Daily stretching exercises are also encouraged, and a handout has been provided for guidance.  -ESSENTIAL TREMOR: Essential tremor is a nervous system disorder that causes involuntary and rhythmic shaking, most often in the hands. You have a new tremor in your left hand and side, which will be further evaluated by a Neurologist. A referral has been made for you to see a specialist.   INSTRUCTIONS:  Please start taking Prednisone tomorrow morning for 5 days and follow the dosage instructions provided. Begin with one pill of Methocarbamol (Robaxin) and increase to two if needed and well-tolerated. Continue with your daily stretching exercises as per the handout. Follow up with Dr. Lajoyce Corners for your foot pain and Dr. Virgilio Frees for your osteoporosis next month. A referral to Neurology has been made for further evaluation of your essential tremor.    PLEASE NOTE:  If you had any lab tests please let us know if you have not heard back within a few days. You may see your results on MyChart before we have a chance to review them but we will give you a call once they are reviewed by [place]. If we ordered any referrals today, please let us know if you have not heard from their office within the next week.

## 2023-03-18 ENCOUNTER — Telehealth: Payer: Self-pay | Admitting: Family

## 2023-03-18 ENCOUNTER — Encounter: Payer: Self-pay | Admitting: Neurology

## 2023-03-18 ENCOUNTER — Encounter: Payer: Self-pay | Admitting: Family

## 2023-03-18 NOTE — Telephone Encounter (Signed)
Patient requests to be called to be advised if she should stop taking  pregabalin (LYRICA) 75 MG capsule  While taking methocarbamol (ROBAXIN) 500 MG tablet

## 2023-03-20 NOTE — Telephone Encounter (Signed)
no, the 2 together are fine, but may cause drowsiness.

## 2023-03-21 NOTE — Telephone Encounter (Signed)
I called pt in regards to message below. Pt verbalized understanding, no other questions or concerns at this time.

## 2023-03-30 ENCOUNTER — Ambulatory Visit: Payer: Medicare Other

## 2023-03-30 VITALS — Wt 135.0 lb

## 2023-03-30 DIAGNOSIS — Z0000 Encounter for general adult medical examination without abnormal findings: Secondary | ICD-10-CM

## 2023-03-30 NOTE — Patient Instructions (Signed)
Janet Solomon , Thank you for taking time to come for your Medicare Wellness Visit. I appreciate your ongoing commitment to your health goals. Please review the following plan we discussed and let me know if I can assist you in the future.   Referrals/Orders/Follow-Ups/Clinician Recommendations: maintain health and activity   Each day, aim for 6 glasses of water, plenty of protein in your diet and try to get up and walk/ stretch every hour for 5-10 minutes at a time.    This is a list of the screening recommended for you and due dates:  Health Maintenance  Topic Date Due   Pneumonia Vaccine (1 of 1 - PCV) Never done   DEXA scan (bone density measurement)  Never done   Hepatitis C Screening  04/08/2023*   Zoster (Shingles) Vaccine (1 of 2) 06/17/2023*   Screening for Lung Cancer  03/16/2024*   DTaP/Tdap/Td vaccine (1 - Tdap) 03/16/2024*   Mammogram  03/16/2024*   Colon Cancer Screening  03/16/2024*   Medicare Annual Wellness Visit  03/29/2024   HPV Vaccine  Aged Out   Flu Shot  Discontinued   COVID-19 Vaccine  Discontinued  *Topic was postponed. The date shown is not the original due date.    Advanced directives: (Declined) Advance directive discussed with you today. Even though you declined this today, please call our office should you change your mind, and we can give you the proper paperwork for you to fill out.  Next Medicare Annual Wellness Visit scheduled for next year: Yes

## 2023-03-30 NOTE — Progress Notes (Signed)
Subjective:   Janet Solomon is a 67 y.o. female who presents for Medicare Annual (Subsequent) preventive examination.  Visit Complete: Virtual I connected with  Janet Solomon on 03/30/23 by a audio enabled telemedicine application and verified that I am speaking with the correct person using two identifiers.  Patient Location: Home  Provider Location: Home Office  I discussed the limitations of evaluation and management by telemedicine. The patient expressed understanding and agreed to proceed.  Vital Signs: Because this visit was a virtual/telehealth visit, some criteria may be missing or patient reported. Any vitals not documented were not able to be obtained and vitals that have been documented are patient reported.  Patient Medicare AWV questionnaire was completed by the patient on 03/23/23 I have confirmed that all information answered by patient is correct and no changes since this date.  Cardiac Risk Factors include: advanced age (>94men, >43 women)     Objective:    Today's Vitals   03/30/23 1443  Weight: 135 lb (61.2 kg)   Body mass index is 26.37 kg/m.     03/30/2023    2:49 PM 03/26/2022    2:27 PM 12/07/2021    2:45 PM  Advanced Directives  Does Patient Have a Medical Advance Directive? No No No  Would patient like information on creating a medical advance directive? No - Patient declined No - Patient declined No - Patient declined    Current Medications (verified) Outpatient Encounter Medications as of 03/30/2023  Medication Sig   alendronate (FOSAMAX) 70 MG tablet Take 1 tablet (70 mg total) by mouth every 7 (seven) days. Take with a full glass of water on an empty stomach.   Magnesium Bisglycinate (MAG GLYCINATE PO) Take by mouth.   methocarbamol (ROBAXIN) 500 MG tablet Take 1-2 tablets (500-1,000 mg total) by mouth every 8 (eight) hours as needed for muscle spasms (Back and leg pain.).   predniSONE (DELTASONE) 20 MG tablet Take 2  pills in the morning with breakfast for 3 days, then 1 pill for 2 days   pregabalin (LYRICA) 75 MG capsule TAKE 1 TABLET 75 MG BY MOUTH TWICE A DAY, MORNING AND BEDTIME.   HYDROcodone-acetaminophen (NORCO/VICODIN) 5-325 MG tablet Take 1 tablet by mouth every 8 (eight) hours as needed for severe pain. (Patient not taking: Reported on 03/30/2023)   [DISCONTINUED] ibuprofen (ADVIL) 800 MG tablet Take 1 tablet (800 mg total) by mouth 3 (three) times daily. Take with food   No facility-administered encounter medications on file as of 03/30/2023.    Allergies (verified) Patient has no known allergies.   History: Past Medical History:  Diagnosis Date   GERD (gastroesophageal reflux disease)    Several years   Past Surgical History:  Procedure Laterality Date   CESAREAN SECTION  1986: 1988   TUBAL LIGATION  1988   Family History  Problem Relation Age of Onset   Cancer Mother    Diabetes Mother    Heart disease Mother    Cancer Sister    Varicose Veins Sister    Cancer Sister    Social History   Socioeconomic History   Marital status: Divorced    Spouse name: Not on file   Number of children: Not on file   Years of education: Not on file   Highest education level: 12th grade  Occupational History   Not on file  Tobacco Use   Smoking status: Former    Current packs/day: 0.00    Average packs/day: 1.5 packs/day for  15.0 years (22.5 ttl pk-yrs)    Types: Cigarettes    Start date: 07/25/2002    Quit date: 07/24/2017    Years since quitting: 5.6   Smokeless tobacco: Never   Tobacco comments:    Quit!  Substance and Sexual Activity   Alcohol use: Not on file   Drug use: Never   Sexual activity: Not Currently  Other Topics Concern   Not on file  Social History Narrative   Not on file   Social Determinants of Health   Financial Resource Strain: Medium Risk (03/30/2023)   Overall Financial Resource Strain (CARDIA)    Difficulty of Paying Living Expenses: Somewhat hard  Food  Insecurity: Food Insecurity Present (03/23/2023)   Hunger Vital Sign    Worried About Running Out of Food in the Last Year: Sometimes true    Ran Out of Food in the Last Year: Sometimes true  Transportation Needs: Unmet Transportation Needs (03/23/2023)   PRAPARE - Administrator, Civil Service (Medical): Yes    Lack of Transportation (Non-Medical): Yes  Physical Activity: Inactive (03/23/2023)   Exercise Vital Sign    Days of Exercise per Week: 0 days    Minutes of Exercise per Session: 0 min  Stress: No Stress Concern Present (03/23/2023)   [employer] of Occupational Health - Occupational Stress Questionnaire    Feeling of Stress : Not at all  Social Connections: Moderately Isolated (03/23/2023)   Social Connection and Isolation Panel [NHANES]    Frequency of Communication with Friends and Family: More than three times a week    Frequency of Social Gatherings with Friends and Family: More than three times a week    Attends Religious Services: 1 to 4 times per year    Active Member of [employer] or Organizations: No    Attends Banker Meetings: Never    Marital Status: Divorced    Tobacco Counseling Counseling given: Not Answered Tobacco comments: Quit!   Clinical Intake:  Pre-visit preparation completed: Yes  Pain : No/denies pain     BMI - recorded: 26.37 Nutritional Status: BMI 25 -29 Overweight Diabetes: No  How often do you need to have someone help you when you read instructions, pamphlets, or other written materials from your doctor or pharmacy?: 1 - Never  Interpreter Needed?: No  Information entered by :: Lanier Ensign, LPN   Activities of Daily Living    03/23/2023    7:54 PM  In your present state of health, do you have any difficulty performing the following activities:  Hearing? 0  Vision? 0  Difficulty concentrating or making decisions? 0  Walking or climbing stairs? 0  Dressing or bathing? 0  Doing errands,  shopping? 0  Preparing Food and eating ? N  Using the Toilet? N  In the past six months, have you accidently leaked urine? Y  Comment wears a pad  Do you have problems with loss of bowel control? N  Managing your Medications? N  Managing your Finances? N  Housekeeping or managing your Housekeeping? N    Patient Care Team: Dulce Sellar, NP as PCP - General (Family Medicine)  Indicate any recent Medical Services you may have received from other than [facility] providers in the past year (date may be approximate).     Assessment:   This is a routine wellness examination for Janira.  Hearing/Vision screen Hearing Screening - Comments:: Pt denies any hearing issues  Vision Screening - Comments:: Pt request referral for macular  degeneration concerns    Goals Addressed             This Visit's Progress    Patient Stated       Maintain health and activity        Depression Screen    03/30/2023    2:46 PM 03/17/2023   10:14 AM 03/26/2022    2:25 PM 06/19/2021    2:23 PM  PHQ 2/9 Scores  PHQ - 2 Score 0 0 0 0  PHQ- 9 Score 0 4      Fall Risk    03/23/2023    7:54 PM 03/17/2023   10:14 AM 04/07/2022    9:53 AM 03/26/2022    2:29 PM 03/23/2022   12:40 PM  Fall Risk   Falls in the past year? 0 0 0 0 0  Number falls in past yr:  0 0 0   Injury with Fall?  0 0 0   Risk for fall due to : Impaired balance/gait No Fall Risks No Fall Risks Impaired vision;Impaired mobility   Follow up Falls prevention discussed Falls evaluation completed Falls evaluation completed Falls prevention discussed     MEDICARE RISK AT HOME: Medicare Risk at Home Any stairs in or around the home?: Yes If so, are there any without handrails?: No Home free of loose throw rugs in walkways, pet beds, electrical cords, etc?: Yes Adequate lighting in your home to reduce risk of falls?: Yes Life alert?: No Use of a cane, walker or w/c?: No Grab bars in the bathroom?: No Shower chair or bench in  shower?: No Elevated toilet seat or a handicapped toilet?: No  TIMED UP AND GO:  Was the test performed?  No    Cognitive Function:        03/30/2023    2:51 PM 03/26/2022    2:31 PM  6CIT Screen  What Year? 0 points 0 points  What month? 0 points 0 points  What time? 0 points 0 points  Count back from 20 0 points 0 points  Months in reverse 0 points 0 points  Repeat phrase 0 points 0 points  Total Score 0 points 0 points    Immunizations  There is no immunization history on file for this patient.  TDAP status: Due, Education has been provided regarding the importance of this vaccine. Advised may receive this vaccine at local pharmacy or Health Dept. Aware to provide a copy of the vaccination record if obtained from local pharmacy or Health Dept. Verbalized acceptance and understanding.  Flu Vaccine status: Declined, Education has been provided regarding the importance of this vaccine but patient still declined. Advised may receive this vaccine at local pharmacy or Health Dept. Aware to provide a copy of the vaccination record if obtained from local pharmacy or Health Dept. Verbalized acceptance and understanding.  Pneumococcal vaccine status: Declined,  Education has been provided regarding the importance of this vaccine but patient still declined. Advised may receive this vaccine at local pharmacy or Health Dept. Aware to provide a copy of the vaccination record if obtained from local pharmacy or Health Dept. Verbalized acceptance and understanding.   Covid-19 vaccine status: Declined, Education has been provided regarding the importance of this vaccine but patient still declined. Advised may receive this vaccine at local pharmacy or Health Dept.or vaccine clinic. Aware to provide a copy of the vaccination record if obtained from local pharmacy or Health Dept. Verbalized acceptance and understanding.  Qualifies for Shingles Vaccine? Yes  Zostavax completed No   Shingrix  Completed?: No.    Education has been provided regarding the importance of this vaccine. Patient has been advised to call insurance company to determine out of pocket expense if they have not yet received this vaccine. Advised may also receive vaccine at local pharmacy or Health Dept. Verbalized acceptance and understanding.  Screening Tests Health Maintenance  Topic Date Due   Pneumonia Vaccine 39+ Years old (1 of 1 - PCV) Never done   DEXA SCAN  Never done   Hepatitis C Screening  04/08/2023 (Originally 01/28/1974)   Zoster Vaccines- Shingrix (1 of 2) 06/17/2023 (Originally 01/28/2004)   Lung Cancer Screening  03/16/2024 (Originally 01/28/2004)   DTaP/Tdap/Td (1 - Tdap) 03/16/2024 (Originally 01/27/1973)   MAMMOGRAM  03/16/2024 (Originally 01/28/2004)   Colonoscopy  03/16/2024 (Originally 05/29/2022)   Medicare Annual Wellness (AWV)  03/29/2024   HPV VACCINES  Aged Out   INFLUENZA VACCINE  Discontinued   COVID-19 Vaccine  Discontinued    Health Maintenance  Health Maintenance Due  Topic Date Due   Pneumonia Vaccine 60+ Years old (1 of 1 - PCV) Never done   DEXA SCAN  Never done    Colorectal cancer screening: No longer required.  May do the cologuard has package   Mammogram status: No longer required due to pt preference .    Lung Cancer Screening: (Low Dose CT Chest recommended if Age 22-80 years, 20 pack-year currently smoking OR have quit w/in 15years.) does qualify.   Lung Cancer Screening Referral: declined at this time   Additional Screening:  Hepatitis C Screening: does qualify  Vision Screening: Recommended annual ophthalmology exams for early detection of glaucoma and other disorders of the eye. Is the patient up to date with their annual eye exam?  No  Who is the provider or what is the name of the office in which the patient attends annual eye exams? Pt request referral for macular degeneration concerns  If pt is not established with a provider, would they like to be  referred to a provider to establish care? Yes .   Dental Screening: Recommended annual dental exams for proper oral hygiene   Community Resource Referral / Chronic Care Management: CRR required this visit?  Yes   CCM required this visit?  No     Plan:     I have personally reviewed and noted the following in the patient's chart:   Medical and social history Use of alcohol, tobacco or illicit drugs  Current medications and supplements including opioid prescriptions. Patient is not currently taking opioid prescriptions. Functional ability and status Nutritional status Physical activity Advanced directives List of other physicians Hospitalizations, surgeries, and ER visits in previous 12 months Vitals Screenings to include cognitive, depression, and falls Referrals and appointments  In addition, I have reviewed and discussed with patient certain preventive protocols, quality metrics, and best practice recommendations. A written personalized care plan for preventive services as well as general preventive health recommendations were provided to patient.     Marzella Schlein, LPN   [id number]   After Visit Summary: (MyChart) Due to this being a telephonic visit, the after visit summary with patients personalized plan was offered to patient via MyChart   Nurse Notes: Pt request referral for dentist and a eye provider who manages macular degeneration

## 2023-04-01 ENCOUNTER — Telehealth: Payer: Self-pay

## 2023-04-01 NOTE — Telephone Encounter (Signed)
   Telephone encounter was:  Unsuccessful.  04/01/2023 Name: Janet Solomon MRN: [id number] DOB: 1955/11/11  Unsuccessful outbound call made today to assist with:  Transportation Needs   Outreach Attempt:  1st Attempt  A HIPAA compliant voice message was left requesting a return call.  Instructed patient to call back at [phone].  Lucindy Borel [facility]  [facility], [facility] Guide Direct Dial: [phone]  Website: [email or web address]
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
## 2023-04-04 ENCOUNTER — Telehealth: Payer: Self-pay

## 2023-04-04 ENCOUNTER — Ambulatory Visit: Payer: Medicare Other | Admitting: Internal Medicine

## 2023-04-04 NOTE — Telephone Encounter (Signed)
   Telephone encounter was:  Successful.  04/04/2023 Name: [name] MRN: [id number] DOB: 04-06-1956  Janet Solomon is a 67 y.o. year old female who is a primary care patient of Dulce Sellar, NP . The community resource team was consulted for assistance with Transportation Needs   Care guide performed the following interventions: Spoke to patient about [employer] transportation. Verified home address to send application. Patient does not require further assistance at this time.  Follow Up Plan:  No further follow up planned at this time. The patient has been provided with needed resources.  Lacrecia Delval [facility]  [facility], [facility] Guide Direct Dial: [phone]  Website: [email or web address]

## 2023-04-04 NOTE — Telephone Encounter (Signed)
   Telephone encounter was:  Unsuccessful.  04/04/2023 Name: Janet Solomon MRN: [id number] DOB: 04/19/1956  Unsuccessful outbound call made today to assist with:  Transportation Needs   Outreach Attempt:  2nd Attempt  A HIPAA compliant voice message was left requesting a return call.  Instructed patient to call back at [phone].  Laquanda Bick [facility]  [facility], [facility] Guide Direct Dial: [phone]  Website: [email or web address]

## 2023-04-05 ENCOUNTER — Ambulatory Visit: Payer: Medicare Other | Admitting: Neurology

## 2023-04-05 ENCOUNTER — Encounter: Payer: Self-pay | Admitting: Neurology

## 2023-04-05 VITALS — BP 132/70 | HR 66 | Ht 60.0 in | Wt 134.2 lb

## 2023-04-05 DIAGNOSIS — M5431 Sciatica, right side: Secondary | ICD-10-CM

## 2023-04-05 DIAGNOSIS — G20A1 Parkinson's disease without dyskinesia, without mention of fluctuations: Secondary | ICD-10-CM | POA: Diagnosis not present

## 2023-04-05 MED ORDER — CARBIDOPA-LEVODOPA 25-100 MG PO TABS: 1.0000 | ORAL_TABLET | Freq: Three times a day (TID) | ORAL | 1 refills | Status: DC

## 2023-04-05 NOTE — Patient Instructions (Addendum)
Start Carbidopa Levodopa as follows: Take 1/2 tablet three times daily, at least 30 minutes before meals (approximately 10am/2pm/6pm), for one week Then take 1/2 tablet in the morning, 1/2 tablet in the afternoon, 1 tablet in the evening, at least 30 minutes before meals, for one week Then take 1/2 tablet in the morning, 1 tablet in the afternoon, 1 tablet in the evening, at least 30 minutes before meals, for one week Then take 1 tablet three times daily at 10am/2pm/6pm, at least 30 minutes before meals   As a reminder, carbidopa/levodopa can be taken at the same time as a carbohydrate, but we like to have you take your pill either 30 minutes before a protein source or 1 hour after as protein can interfere with carbidopa/levodopa absorption.  A referral to [facility] has been placed for your MRI someone will contact you directly to schedule your appt. They are located at [address] [facility]. Please contact them directly by calling [phone] with any questions regarding your referral.   We discussed that you can sign yourself up for the Parkinsons [employer] study on genetics through their website.  The study is called Parkinsons Disease GeneRation and click Enroll Now.    You need a regular daily schedule and need to get up before 10am!  No naps after 2pm.

## 2023-04-06 ENCOUNTER — Other Ambulatory Visit: Payer: Self-pay | Admitting: Physical Medicine and Rehabilitation

## 2023-04-10 ENCOUNTER — Encounter: Payer: Self-pay | Admitting: Family

## 2023-04-13 ENCOUNTER — Encounter: Payer: Self-pay | Admitting: Internal Medicine

## 2023-04-13 ENCOUNTER — Ambulatory Visit: Payer: Medicare Other | Admitting: Internal Medicine

## 2023-04-13 VITALS — BP 120/70 | HR 94 | Ht 60.0 in | Wt 135.8 lb

## 2023-04-13 DIAGNOSIS — E559 Vitamin D deficiency, unspecified: Secondary | ICD-10-CM

## 2023-04-13 DIAGNOSIS — M810 Age-related osteoporosis without current pathological fracture: Secondary | ICD-10-CM | POA: Diagnosis not present

## 2023-04-13 DIAGNOSIS — E211 Secondary hyperparathyroidism, not elsewhere classified: Secondary | ICD-10-CM

## 2023-04-13 LAB — BASIC METABOLIC PANEL
BUN: 10 mg/dL (ref 6–23)
CO2: 29 meq/L (ref 19–32)
Calcium: 9.1 mg/dL (ref 8.4–10.5)
Chloride: 106 meq/L (ref 96–112)
Creatinine, Ser: 0.92 mg/dL (ref 0.40–1.20)
GFR: 63.67 mL/min (ref >60.00)
Glucose, Bld: 105 mg/dL — ABNORMAL HIGH (ref 70–99)
Potassium: 4.3 meq/L (ref 3.5–5.1)
Sodium: 141 meq/L (ref 135–145)

## 2023-04-13 LAB — VITAMIN D 25 HYDROXY (VIT D DEFICIENCY, FRACTURES): VITD: 25.88 ng/mL — ABNORMAL LOW (ref 30.00–100.00)

## 2023-04-13 MED ORDER — ALENDRONATE SODIUM 70 MG PO TABS: 70.0000 mg | ORAL_TABLET | ORAL | 3 refills | Status: DC

## 2023-04-13 NOTE — Patient Instructions (Signed)
Please take Calcium 600 mg twice daily  Please make sure you check how much vitamin D you are taking as its important for your bone health as well

## 2023-04-13 NOTE — Progress Notes (Unsigned)
Name: Janet Solomon  MRN/ DOB: [id number], Apr 13, 1956    Age/ Sex: 67 y.o., female    PCP: Dulce Sellar, NP   Reason for Endocrinology Evaluation: Osteoporosis     Date of Initial Endocrinology Evaluation: 09/28/2022    HPI: Ms. Janet Solomon is a 67 y.o. female with a past medical history of osteoporosis, chronic back pain. The patient presented for initial endocrinology clinic visit on 09/28/2022 for consultative assistance with her osteoporosis.     Patient was referred by [place] [facility] specialist for further evaluation of osteoporosis.  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     Pt was diagnosed with osteoporosis:  Menarche at age : 26 Menopausal at age : ~20  Fracture Hx: self diagnosis of toe fractures  in her 15's , fifth metatarsal fracture 12/2022 Hx of HRT: no FH of osteoporosis or hip fracture: no Prior Hx of anti-estrogenic therapy : n/a Prior Hx of anti-resorptive therapy : Started alendronate 09/28/2022   On her initial visit to our clinic she was not on any calcium except for vitamin D, B12, and K2   Sister with Clemencia Course' disease   Pt with transportation issues    SUBJECTIVE:    Today (04/13/23):  Janet Solomon is here for follow-up on osteoporosis.   She was started on alendronate 09/2022 She sustained 5th  metatarsal fracture 12/2022, after a jump off the bed  Denies heartburn Continues with alternating constipation and diarrhea Denies palpitations  She has back pain associated with sciatica    Calcium 600 mg twice daily Vitamin D unknown dose  Alendronate 70 mg weekly  HISTORY:  Past Medical History:  Past Medical History:  Diagnosis Date   GERD (gastroesophageal reflux disease)    Several years   Sciatica    Past Surgical History:  Past Surgical History:  Procedure Laterality Date   CESAREAN SECTION   1986: 1988   TUBAL LIGATION  1988    Social History:  reports that she quit smoking about 5 years ago. Her smoking use included cigarettes. She started smoking about 20 years ago. She has a 22.5 pack-year smoking history. She has never used smokeless tobacco. She reports that she does not drink alcohol and does not use drugs. Family History: family history includes Cancer in her mother, sister, and sister; Diabetes in her mother; Heart disease in her mother; Parkinson's disease in her sister; Varicose Veins in her sister.   HOME MEDICATIONS: Allergies as of 04/13/2023   No Known Allergies      Medication List        Accurate as of April 13, 2023  8:24 AM. If you have any questions, ask your nurse or doctor.          alendronate 70 MG tablet Commonly known as: FOSAMAX Take 1 tablet (70 mg total) by mouth every 7 (seven) days. Take with a full glass of water on an empty stomach.   carbidopa-levodopa 25-100 MG tablet Commonly known as: SINEMET IR Take 1 tablet by mouth 3 (three) times daily. 10am/2pm/6pm   HYDROcodone-acetaminophen 5-325 MG tablet Commonly known as: NORCO/VICODIN Take 1 tablet by mouth every 8 (eight) hours as needed for severe pain.   K2-D3 5000 5000-90 UNIT-MCG Caps Take by mouth.   MAG GLYCINATE PO Take by mouth.   methocarbamol 500 MG tablet Commonly known as: ROBAXIN Take 1-2 tablets (500-1,000 mg total) by mouth every 8 (eight)  hours as needed for muscle spasms (Back and leg pain.).   pregabalin 75 MG capsule Commonly known as: LYRICA TAKE 1 TABLET 75 MG BY MOUTH TWICE A DAY, MORNING AND BEDTIME.          REVIEW OF SYSTEMS: A comprehensive ROS was conducted with the patient and is negative except as per HPI    OBJECTIVE:  VS: BP 120/70 (BP Location: Left Arm, Patient Position: Sitting, Cuff Size: Small)   Pulse 94   Ht 5' (1.524 m)   Wt 135 lb 12.8 oz (61.6 kg)   SpO2 (!) 75%   BMI 26.52 kg/m    Wt Readings from Last 3  Encounters:  04/13/23 135 lb 12.8 oz (61.6 kg)  04/05/23 134 lb 3.2 oz (60.9 kg)  03/30/23 135 lb (61.2 kg)     EXAM: General: Pt appears well and is in NAD  Neck: General: Supple without adenopathy. Thyroid: Thyroid size normal.  No goiter or nodules appreciated.  Lungs: Clear with good BS bilat   Heart: Auscultation: RRR.  Extremities:  BL LE: No pretibial edema   Mental Status: Judgment, insight: Intact Orientation: Oriented to time, place, and person Mood and affect: No depression, anxiety, or agitation     DATA REVIEWED:     Latest Reference Range & Units 04/13/23 08:48  Sodium 135 - 145 mEq/L 141  Potassium 3.5 - 5.1 mEq/L 4.3  Chloride 96 - 112 mEq/L 106  CO2 19 - 32 mEq/L 29  Glucose 70 - 99 mg/dL 161 (H)  BUN 6 - 23 mg/dL 10  Creatinine 0.96 - 0.45 mg/dL 4.09  Calcium 8.4 - 81.1 mg/dL 9.1  GFR >91.47 mL/min 63.67  VITD 30.00 - 100.00 ng/mL 25.88 (L)    Latest Reference Range & Units 04/13/23 08:48  PTH, Intact 16 - 77 pg/mL 70       DXA @ [facility] 12/10/2021    T-score  AP -2.0  RFN -2.5  Right TH -1.5  LFN +2.0  Left TH  -1.2  L 1/3 distal radius -1.7   Old records , labs and images have been reviewed.    ASSESSMENT/PLAN/RECOMMENDATIONS:   Osteoporosis:  -Emphasized the importance of optimizing calcium and vitamin D intake, patient continues to be unable to tell me how much vitamin D she is taking  -Patient is limited on exercise due to chronic back pain, but I emphasized the importance of weightbearing exercises when possible -Patient is wondering about DXA scan as she is having back issues, I did explain to the patient that DXA scan is not the best modality for investigating back pain and sciatica, she will need to follow-up with her orthopedic -I have recommended postponing DXA scan until a year from now, that way she will have enough time on alendronate before we assess BMD  Medications : Continue calcium 600 mg twice daily Continue  alendronate 70 mg weekly   2. Secondary Hyperparathyroidism:  -This has resolved on today's labs but vitamin D remains low   3. Vitamin D Insufficiency :  -Unfortunately she continues not to know how much vitamin D she is taking, I did ask her to double up in the past, but it does not appear that she has done so. -I will again emphasized the importance of knowing how much vitamin D she is taking and doubling up the dose -We emphasized the importance of optimizing vitamin D to improve BMD   Follow-up in 1 yr    Signed electronically by: Lamount Cranker  Lonzo Cloud, MD  [facility] Endocrinology  [facility] [address] [place], [place] [address] Phone: [phone] FAX: [phone]   CC: Dulce Sellar, NP [address] [place] [address] Phone: [phone] Fax: [phone]   Return to Endocrinology clinic as below: Future Appointments  Date Time Provider Department Center  04/13/2023  8:50 AM Windie Marasco, Konrad Dolores, MD [facility] None  04/27/2023  1:00 PM [facility] 1 [facility]  05/03/2023 12:30 PM Plaster, Jill Alexanders, PT [facility]  09/02/2023  1:00 PM Tat, Octaviano Batty, DO [facility] None

## 2023-04-14 LAB — PARATHYROID HORMONE, INTACT (NO CA): PTH: 70 pg/mL (ref 16–77)

## 2023-04-27 ENCOUNTER — Ambulatory Visit
Admission: RE | Admit: 2023-04-27 | Discharge: 2023-04-27 | Disposition: A | Discharge status: Home / Self Care | Payer: Medicare Other | Source: Ambulatory Visit | Attending: Neurology | Admitting: Neurology

## 2023-04-27 DIAGNOSIS — G20A1 Parkinson's disease without dyskinesia, without mention of fluctuations: Secondary | ICD-10-CM

## 2023-05-03 ENCOUNTER — Ambulatory Visit: Payer: Medicare Other | Admitting: Physical Therapy

## 2023-05-04 ENCOUNTER — Other Ambulatory Visit: Payer: Self-pay

## 2023-05-04 ENCOUNTER — Telehealth: Payer: Self-pay | Admitting: Neurology

## 2023-05-04 DIAGNOSIS — R6889 Other general symptoms and signs: Secondary | ICD-10-CM

## 2023-05-04 NOTE — Telephone Encounter (Signed)
Pt called in stating she wants to speak with Dr. Arbutus Leas to go over her MRI results before she goes to an Best boy.

## 2023-05-05 NOTE — Telephone Encounter (Signed)
Called pateint and explained and she more than understood

## 2023-05-09 ENCOUNTER — Ambulatory Visit: Payer: Medicare Other | Attending: Neurology | Admitting: Physical Therapy

## 2023-05-09 ENCOUNTER — Encounter: Payer: Self-pay | Admitting: Physical Therapy

## 2023-05-09 DIAGNOSIS — M5441 Lumbago with sciatica, right side: Secondary | ICD-10-CM | POA: Insufficient documentation

## 2023-05-09 DIAGNOSIS — M6281 Muscle weakness (generalized): Secondary | ICD-10-CM | POA: Diagnosis not present

## 2023-05-09 DIAGNOSIS — R2681 Unsteadiness on feet: Secondary | ICD-10-CM | POA: Diagnosis not present

## 2023-05-09 DIAGNOSIS — R2689 Other abnormalities of gait and mobility: Secondary | ICD-10-CM | POA: Diagnosis not present

## 2023-05-09 DIAGNOSIS — G20A1 Parkinson's disease without dyskinesia, without mention of fluctuations: Secondary | ICD-10-CM | POA: Insufficient documentation

## 2023-05-09 DIAGNOSIS — G8929 Other chronic pain: Secondary | ICD-10-CM | POA: Insufficient documentation

## 2023-05-09 NOTE — Therapy (Signed)
OUTPATIENT PHYSICAL THERAPY NEURO EVALUATION   Patient Name: Janet Solomon MRN: [id number] DOB:11/10/1955, 67 y.o., female Today's Date: 05/09/2023   PCP: Dulce Sellar, NP  REFERRING PROVIDER: Vladimir Faster, DO  END OF SESSION:  PT End of Session - 05/09/23 1321     Visit Number 1    Number of Visits 9    Date for PT Re-Evaluation 07/18/23    Authorization Type [employer] Medicare    PT Start Time 1319    PT Stop Time 1402    PT Time Calculation (min) 43 min    Activity Tolerance Patient tolerated treatment well    Behavior During Therapy WFL for tasks assessed/performed             Past Medical History:  Diagnosis Date   GERD (gastroesophageal reflux disease)    Several years   Sciatica    Past Surgical History:  Procedure Laterality Date   CESAREAN SECTION  1986: 1988   TUBAL LIGATION  1988   Patient Active Problem List   Diagnosis Date Noted   Vitamin D insufficiency 04/13/2023   Secondary hyperparathyroidism, non-renal (HCC) 04/13/2023   Age-related osteoporosis without current pathological fracture 04/13/2023   Chronic sciatica of right side 06/19/2021    ONSET DATE: 04/05/2023 (referral)   REFERRING DIAG: G20.A1 (ICD-10-CM) - 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 Pt reports she started noticing a tremor in her left hand last year, did not think much of it. 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 Has not  noticed a difference with the Dopamine yet. States she has a long history of low back pain, injured her back 5 years ago lifting a heavy box off a high shelf. Has a history of osteoporosis, just started taking 1200mg  of Vitamin D. Broke her 5th metatarsal on her L foot and has not been able to walk well on it since. Lives a sedentary lifestyle and spends a lot of time napping during the day. States her medicine makes her tired and her back pain limits her.    Pt accompanied by:  Daughter, Koleen Nimrod   PERTINENT HISTORY: osteoporosis, chronic back  pain  PAIN:  Are you having pain? Yes: NPRS scale: 6/10 Pain location: Low back w/ RLE sciatica  Pain description: Achy/sharp  Aggravating factors: Every thing hurts  Relieving factors: Sleeping  PRECAUTIONS: Fall  RED FLAGS: Bowel or bladder incontinence: Yes: reports this started last year but hasn't gotten worse     WEIGHT BEARING RESTRICTIONS: No  FALLS: Has patient fallen in last 6 months? No  LIVING ENVIRONMENT: Lives with: lives with their family Lives in: House/apartment Stairs: Yes: Internal: full flight steps; on left going up and External: one steps; none Has following equipment at home: Grab bars  PLOF: Independent  PATIENT GOALS: "to be without pain and sleep a full night without waking up"   OBJECTIVE:  Note: Objective measures were completed at Evaluation unless otherwise noted.  DIAGNOSTIC FINDINGS: MRI of brain on 04/27/23   IMPRESSION: 1.  No evidence of an acute intracranial abnormality. 2. 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 3. Moderate multifocal T2 FLAIR hyperintense signal abnormality within the cerebral white matter, nonspecific but most often secondary to chronic small vessel ischemia. 4. Indeterminate calvarial lesions as described, one within the right frontal calvarium and the other within the left parietal calvarium. Although nonspecific, osseous metastases  cannot be excluded. Correlate clinically and consider a short-interval follow-up brain MRI in 2 months to ensure stability. 5. Minimal left sphenoid and left maxillary sinus mucosal thickening.  MRI of lumbar spine from 07/11/2021  IMPRESSION: 1. 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 2. Underlying moderate dextroscoliosis with additional moderate multilevel spondylosis at L1-2 through L4-5 as above. No other high-grade spinal stenosis. Moderate bilateral L3 and L4 foraminal narrowing. 3. 5.4 cm T2 hyperintense cystic lesion within the right adnexa, partially visualized, and not fully characterized on this exam. Follow-up examination with dedicated pelvic ultrasound recommended for further evaluation.  COGNITION: Overall cognitive status: Within functional limits for tasks assessed   SENSATION: Pt denies numbness/tingling in BLEs    POSTURE: rounded shoulders and forward head, resting tremor on LUE   LOWER EXTREMITY ROM:     Active  Right Eval Left Eval  Hip flexion P!   Hip extension    Hip abduction    Hip adduction    Hip internal rotation    Hip external rotation    Knee flexion    Knee extension -10 degrees (pain)   Ankle dorsiflexion    Ankle plantarflexion    Ankle inversion    Ankle eversion     (Blank rows = not tested)  LOWER EXTREMITY MMT:  Tested in seated position   MMT Right Eval Left Eval  Hip flexion 4- (pain)  4+  Hip extension    Hip abduction 5 5  Hip adduction 5 5  Hip internal rotation    Hip external rotation    Knee flexion 4- 4+  Knee extension 4- 4 (pain)  Ankle dorsiflexion 5 5  Ankle plantarflexion    Ankle inversion    Ankle eversion    (Blank rows = not tested)  BED MOBILITY:  Independent per pt   TRANSFERS: Assistive device utilized: None  Sit to stand: Modified independence Stand to sit: Modified  independence No UE support, Decreased anterior weight shift and noted bracing of BLEs against back of chair w/occasional retropulsion    GAIT: Gait pattern: step through pattern, decreased arm swing- Left, decreased stride length, and  lateral hip instability Distance walked: Various clinic distances  Assistive device utilized: None Level of assistance: Modified independence Comments: No instability noted - gait to be further assessed next session   FUNCTIONAL TESTS:   [facility] PT Assessment - 05/09/23 1355       Transfers   Five time sit to stand comments  10.84s   No UE support, retropulsion on rep 3             TODAY'S TREATMENT:        Next Session                                                                                                                           PATIENT EDUCATION: Education details: POC, eval findings, info on PD-specific PT Person educated: Patient and Child(ren) Education method: Explanation Education comprehension: verbalized understanding and needs further education  HOME EXERCISE PROGRAM: To be established  GOALS: Goals reviewed with patient? Yes  SHORT TERM GOALS: Target date: 06/06/2023   Pt will be independent with initial HEP for improved strength, balance, transfers and gait.  Baseline: not established on eval  Goal status: INITIAL  2.  MiniBest to be assessed and LTG updated  Baseline:  Goal status: INITIAL  3.  Modified Oswestry to be assessed and LTG updated  Baseline:  Goal status: INITIAL   LONG TERM GOALS: Target date: 07/04/2023   Pt will be independent with final HEP for improved strength, balance, transfers and gait.  Baseline:  Goal status: INITIAL  2.  MiniBest goal  Baseline:  Goal status: INITIAL  3.  Modified Oswestry goal  Baseline:  Goal status: INITIAL   ASSESSMENT:  CLINICAL IMPRESSION: Patient is a 68 year old female referred to Neuro OPPT for PD.   Pt's PMH is significant for:  osteoporosis, chronic back pain. The following deficits were present during the exam: decreased functional strength, tremor of LUE, impaired sensation, decreased A/ROM of RLE due to sciatica pain and decreased activity tolerance. Based on PD diagnosis and sedentary lifestyle, pt is an incr risk for falls. Balance to be further assessed next session. Pt would benefit from skilled PT to address these impairments and functional limitations to maximize functional mobility independence.    OBJECTIVE IMPAIRMENTS: Abnormal gait, decreased activity tolerance, decreased balance, decreased endurance, decreased knowledge of condition, decreased knowledge of use of DME, decreased mobility, difficulty walking, decreased ROM, decreased strength, decreased safety awareness, impaired perceived functional ability, impaired flexibility, impaired sensation, and pain  ACTIVITY LIMITATIONS: carrying, lifting, bending, squatting, stairs, hygiene/grooming, locomotion level, and caring for others  PARTICIPATION LIMITATIONS: meal prep, medication management, driving, shopping, community activity, and yard work  PERSONAL FACTORS: Age, Fitness, Past/current experiences, Transportation, and 1-2 comorbidities: PD and RLE sciatica  are also affecting patient's functional outcome.   REHAB POTENTIAL: Good  CLINICAL DECISION MAKING: Evolving/moderate complexity  EVALUATION COMPLEXITY: Moderate  PLAN:  PT FREQUENCY: 1x/week  PT DURATION: 8 weeks  PLANNED INTERVENTIONS: 57846- PT Re-evaluation, 97110-Therapeutic  exercises, 97530- Therapeutic activity, [id number]- Neuromuscular re-education, [phone]- Self Care, [id number]- Manual therapy, [phone]- Gait training, [phone]- Aquatic Therapy, Patient/Family education, Balance training, Stair training, Dry Needling, Joint mobilization, and DME instructions  PLAN FOR NEXT SESSION: MiniBest and Modified Oswestry and update goals. Establish HEP for R sciatica, stretching of RLE and balance deficits  highlighted by MiniBest. Pt does not have transportation so cannot do PD community classes.    Jill Alexanders Damonique Brunelle, PT, DPT 05/09/2023, 2:05 PM

## 2023-05-11 ENCOUNTER — Telehealth: Payer: Self-pay | Admitting: Neurology

## 2023-05-11 NOTE — Telephone Encounter (Signed)
Caller stated she would like to speak with [facility]. Stated she has not received call yet from referral that was sent by Dr Tat

## 2023-05-11 NOTE — Telephone Encounter (Signed)
Called patient after doing some research I see they are reviewing and getting approval for the appointment I let patient know

## 2023-05-12 ENCOUNTER — Other Ambulatory Visit: Payer: Self-pay | Admitting: Internal Medicine

## 2023-05-12 ENCOUNTER — Telehealth: Payer: Self-pay | Admitting: Neurology

## 2023-05-12 DIAGNOSIS — R9089 Other abnormal findings on diagnostic imaging of central nervous system: Secondary | ICD-10-CM

## 2023-05-12 NOTE — Telephone Encounter (Signed)
Pt states that she was to get a referral to a oncologist and has not heard anything. She would like a call back

## 2023-05-16 ENCOUNTER — Encounter: Payer: Self-pay | Admitting: Oncology

## 2023-05-16 ENCOUNTER — Ambulatory Visit: Payer: Medicare Other | Admitting: Physical Therapy

## 2023-05-16 VITALS — BP 150/76 | HR 64

## 2023-05-16 DIAGNOSIS — G8929 Other chronic pain: Secondary | ICD-10-CM

## 2023-05-16 DIAGNOSIS — M5441 Lumbago with sciatica, right side: Secondary | ICD-10-CM | POA: Diagnosis not present

## 2023-05-16 DIAGNOSIS — M6281 Muscle weakness (generalized): Secondary | ICD-10-CM | POA: Diagnosis not present

## 2023-05-16 DIAGNOSIS — R2681 Unsteadiness on feet: Secondary | ICD-10-CM | POA: Diagnosis not present

## 2023-05-16 DIAGNOSIS — R2689 Other abnormalities of gait and mobility: Secondary | ICD-10-CM

## 2023-05-16 DIAGNOSIS — G20A1 Parkinson's disease without dyskinesia, without mention of fluctuations: Secondary | ICD-10-CM | POA: Diagnosis not present

## 2023-05-16 NOTE — Therapy (Signed)
OUTPATIENT PHYSICAL THERAPY NEURO TREATMENT   Patient Name: Janet Solomon MRN: [id number] DOB:1956/03/12, 68 y.o., female Today's Date: 05/16/2023   PCP: Dulce Sellar, NP  REFERRING PROVIDER: Vladimir Faster, DO  END OF SESSION:  PT End of Session - 05/16/23 0938     Visit Number 2    Number of Visits 9    Date for PT Re-Evaluation 07/18/23    Authorization Type [employer] Medicare    PT Start Time [phone]   Previous pt session ran late   PT Stop Time 1018    PT Time Calculation (min) 41 min    Activity Tolerance Patient tolerated treatment well    Behavior During Therapy [facility] for tasks assessed/performed             Past Medical History:  Diagnosis Date   GERD (gastroesophageal reflux disease)    Several years   Sciatica    Past Surgical History:  Procedure Laterality Date   CESAREAN SECTION  1986: 1988   TUBAL LIGATION  1988   Patient Active Problem List   Diagnosis Date Noted   Vitamin D insufficiency 04/13/2023   Secondary hyperparathyroidism, non-renal (HCC) 04/13/2023   Age-related osteoporosis without current pathological fracture 04/13/2023   Chronic sciatica of right side 06/19/2021    ONSET DATE: 04/05/2023 (referral)   REFERRING DIAG: G20.A1 (ICD-10-CM) - Parkinson's disease without dyskinesia or fluctuating manifestations (HCC)  THERAPY DIAG:  Other abnormalities of gait and mobility  Unsteadiness on feet  Chronic bilateral low back pain with right-sided sciatica  Muscle weakness (generalized)  Rationale for Evaluation and Treatment: Rehabilitation  SUBJECTIVE:                                                                                                                                                                                             SUBJECTIVE STATEMENT: Pt reports doing well. Denies changes since last visit. Rating her pain as a 5/10 today.    Pt accompanied by:  Daughter, Koleen Nimrod   PERTINENT HISTORY:  osteoporosis, chronic back pain  PAIN:  Are you having pain? Yes: NPRS scale: 5/10 Pain location: Low back w/ RLE sciatica  Pain description: Achy/sharp  Aggravating factors: Every thing hurts  Relieving factors: Sleeping  PRECAUTIONS: Fall  RED FLAGS: Bowel or bladder incontinence: Yes: reports this started last year but hasn't gotten worse     WEIGHT BEARING RESTRICTIONS: No  FALLS: Has patient fallen in last 6 months? No  LIVING ENVIRONMENT: Lives with: lives with their family Lives in: House/apartment Stairs: Yes: Internal: full flight steps; on left going up and External: one steps; none Has following  equipment at home: Grab bars  PLOF: Independent  PATIENT GOALS: "to be without pain and sleep a full night without waking up"   OBJECTIVE:  Note: Objective measures were completed at Evaluation unless otherwise noted.  DIAGNOSTIC FINDINGS: MRI of brain on 04/27/23   IMPRESSION: 1.  No evidence of an acute intracranial abnormality. 2. 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 3. Moderate multifocal T2 FLAIR hyperintense signal abnormality within the cerebral white matter, nonspecific but most often secondary to chronic small vessel ischemia. 4. Indeterminate calvarial lesions as described, one within the right frontal calvarium and the other within the left parietal calvarium. Although nonspecific, osseous metastases cannot be excluded. Correlate clinically and consider a short-interval follow-up brain MRI in 2 months to ensure stability. 5. Minimal left sphenoid and left maxillary sinus mucosal thickening.  MRI of lumbar spine from 07/11/2021  IMPRESSION: 1. 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 2. Underlying moderate dextroscoliosis with additional moderate multilevel spondylosis at L1-2 through L4-5 as above. No  other high-grade spinal stenosis. Moderate bilateral L3 and L4 foraminal narrowing. 3. 5.4 cm T2 hyperintense cystic lesion within the right adnexa, partially visualized, and not fully characterized on this exam. Follow-up examination with dedicated pelvic ultrasound recommended for further evaluation.  COGNITION: Overall cognitive status: Within functional limits for tasks assessed   SENSATION: Pt denies numbness/tingling in BLEs    POSTURE: rounded shoulders and forward head, resting tremor on LUE   LOWER EXTREMITY ROM:     Active  Right Eval Left Eval  Hip flexion P!   Hip extension    Hip abduction    Hip adduction    Hip internal rotation    Hip external rotation    Knee flexion    Knee extension -10 degrees (pain)   Ankle dorsiflexion    Ankle plantarflexion    Ankle inversion    Ankle eversion     (Blank rows = not tested)  LOWER EXTREMITY MMT:  Tested in seated position   MMT Right Eval Left Eval  Hip flexion 4- (pain)  4+  Hip extension    Hip abduction 5 5  Hip adduction 5 5  Hip internal rotation    Hip external rotation    Knee flexion 4- 4+  Knee extension 4- 4 (pain)  Ankle dorsiflexion 5 5  Ankle plantarflexion    Ankle inversion    Ankle eversion    (Blank rows = not tested)  BED MOBILITY:  Independent per pt   TRANSFERS: Assistive device utilized: None  Sit to stand: Modified independence Stand to sit: Modified independence No UE support, Decreased anterior weight shift and noted bracing of BLEs against back of chair w/occasional retropulsion    VITALS  Vitals:   05/16/23 0941  BP: (!) 150/76  Pulse: 64    TODAY'S TREATMENT:        Ther Act  Assessed vitals (see above) and systolic BP elevated but within limits for session. Pt reports she does not check BP at home.  Modified Oswestry: 20/50  Ther Ex  SciFit multi-peaks level 8 for 8 minutes using BUE/BLEs for neural priming for reciprocal movement, dynamic cardiovascular  warmup and increased amplitude of stepping. Min cues  RPE of 7/10 following activity. Pt reported discomfort in low back following activity, so focused on that for remainder of session.  Established initial HEP (see bolded below) for  improved low back pain modulation and functional mobility:  Seated figure-4 piriformis stretch, x90s hold per side. Pt more symptomatic on R side but was able to tolerate w/deep breathing technique.  Supine LTR, x10 per side. Pt w/reduced ROM to L side but did improve w/repetition.  Supine sciatic nerve glides, x10 per side. Pt very symptomatic w/this bilaterally, but was able to improve ROM w/repetition.   Gait pattern: step through pattern, decreased stride length, lateral hip instability, and wide BOS Distance walked: Various clinic distances Assistive device utilized: None Level of assistance: Modified independence Comments: No instability noted                                                                                                                            PATIENT EDUCATION: Education details: Initial HEP Person educated: Patient and Child(ren) Education method: Explanation, Demonstration, and Handouts Education comprehension: verbalized understanding, returned demonstration, and needs further education  HOME EXERCISE PROGRAM: Access Code: [email or web address] URL: [email or web address] Date: 05/16/2023 Prepared by: Alethia Berthold Lowery Paullin  Exercises - Seated Piriformis Stretch  - 1 x daily - 7 x weekly - 2-3 sets - 60 second hold - Supine Sciatic Nerve Glide  - 1 x daily - 7 x weekly - 3 sets - 10 reps - Lower Trunk Rotation Stretch  - 1 x daily - 7 x weekly - 3 sets - 10 reps  GOALS: Goals reviewed with patient? Yes  SHORT TERM GOALS: Target date: 06/06/2023   Pt will be independent with initial HEP for improved strength, balance, transfers and gait.  Baseline: not established on eval  Goal status: INITIAL  2.  MiniBest to be assessed  and LTG updated  Baseline:  Goal status: INITIAL  3.  Modified Oswestry to be assessed and LTG updated  Baseline:  Goal status: MET   LONG TERM GOALS: Target date: 07/04/2023   Pt will be independent with final HEP for improved strength, balance, transfers and gait.  Baseline:  Goal status: INITIAL  2.  MiniBest goal  Baseline:  Goal status: INITIAL  3.  Pt will score </= 11/20 on Modified Oswestry for improved functional mobility and reduced pain levels  Baseline: 20/50 Goal status: REVISED    ASSESSMENT:  CLINICAL IMPRESSION: Emphasis of skilled PT session on pain assessment via Modified Oswestry and establishing initial HEP. Pt continues to be highly limited by low back pain w/R-sided sciatica, so addressed that today and will assess MiniBest next session. Pt very challenged by Scifit, requiring min cues to maintain SPM. Pt reported pain w/stretches added to HEP but did improve w/time and deep breathing technique. Continue POC.    OBJECTIVE IMPAIRMENTS: Abnormal gait, decreased activity tolerance, decreased balance, decreased endurance, decreased knowledge of condition, decreased knowledge of use of DME, decreased mobility, difficulty walking, decreased ROM, decreased strength, decreased safety awareness, impaired perceived functional ability, impaired flexibility, impaired sensation, and pain  ACTIVITY LIMITATIONS: carrying, lifting, bending, squatting, stairs, hygiene/grooming, locomotion level, and caring for others  PARTICIPATION LIMITATIONS: meal prep, medication management, driving, shopping, community activity, and yard work  PERSONAL FACTORS: Age, Fitness, Past/current experiences, Transportation, and 1-2 comorbidities: PD and RLE sciatica  are also affecting patient's functional outcome.   REHAB POTENTIAL: Good  CLINICAL DECISION MAKING: Evolving/moderate complexity  EVALUATION COMPLEXITY: Moderate  PLAN:  PT FREQUENCY: 1x/week  PT DURATION: 8  weeks  PLANNED INTERVENTIONS: 97164- PT Re-evaluation, 97110-Therapeutic exercises, 97530- Therapeutic activity, 97112- Neuromuscular re-education, 97535- Self Care, 16109- Manual therapy, [phone]- Gait training, [phone]- Aquatic Therapy, Patient/Family education, Balance training, Stair training, Dry Needling, Joint mobilization, and DME instructions  PLAN FOR NEXT SESSION: MiniBest and update goal. Add to HEP for R sciatica, stretching of RLE and balance deficits highlighted by MiniBest. Pt does not have transportation so cannot do PD community classes.    Jill Alexanders Avarey Yaeger, PT, DPT 05/16/2023, 10:18 AM

## 2023-05-23 ENCOUNTER — Ambulatory Visit: Payer: Medicare Other | Admitting: Physical Therapy

## 2023-05-23 ENCOUNTER — Encounter: Payer: Self-pay | Admitting: Physical Therapy

## 2023-05-23 VITALS — BP 120/68 | HR 71

## 2023-05-23 DIAGNOSIS — G8929 Other chronic pain: Secondary | ICD-10-CM

## 2023-05-23 DIAGNOSIS — R2681 Unsteadiness on feet: Secondary | ICD-10-CM

## 2023-05-23 DIAGNOSIS — M6281 Muscle weakness (generalized): Secondary | ICD-10-CM | POA: Diagnosis not present

## 2023-05-23 DIAGNOSIS — M5441 Lumbago with sciatica, right side: Secondary | ICD-10-CM | POA: Diagnosis not present

## 2023-05-23 DIAGNOSIS — R2689 Other abnormalities of gait and mobility: Secondary | ICD-10-CM | POA: Diagnosis not present

## 2023-05-23 DIAGNOSIS — G20A1 Parkinson's disease without dyskinesia, without mention of fluctuations: Secondary | ICD-10-CM | POA: Diagnosis not present

## 2023-05-23 NOTE — Therapy (Signed)
OUTPATIENT PHYSICAL THERAPY NEURO TREATMENT   Patient Name: Janet Solomon MRN: [id number] DOB:11-24-1955, 67 y.o., female Today's Date: 05/23/2023   PCP: Dulce Sellar, NP  REFERRING PROVIDER: Vladimir Faster, DO  END OF SESSION:  PT End of Session - 05/23/23 1319     Visit Number 3    Number of Visits 9    Date for PT Re-Evaluation 07/18/23    Authorization Type [employer] Medicare    PT Start Time 1318    PT Stop Time 1358    PT Time Calculation (min) 40 min    Equipment Utilized During Treatment Gait belt    Activity Tolerance Patient tolerated treatment well    Behavior During Therapy WFL for tasks assessed/performed             Past Medical History:  Diagnosis Date   GERD (gastroesophageal reflux disease)    Several years   Sciatica    Past Surgical History:  Procedure Laterality Date   CESAREAN SECTION  1986: 1988   TUBAL LIGATION  1988   Patient Active Problem List   Diagnosis Date Noted   Vitamin D insufficiency 04/13/2023   Secondary hyperparathyroidism, non-renal (HCC) 04/13/2023   Age-related osteoporosis without current pathological fracture 04/13/2023   Chronic sciatica of right side 06/19/2021    ONSET DATE: 04/05/2023 (referral)   REFERRING DIAG: G20.A1 (ICD-10-CM) - Parkinson's disease without dyskinesia or fluctuating manifestations (HCC)  THERAPY DIAG:  Other abnormalities of gait and mobility  Unsteadiness on feet  Chronic bilateral low back pain with right-sided sciatica  Rationale for Evaluation and Treatment: Rehabilitation  SUBJECTIVE:                                                                                                                                                                                             SUBJECTIVE STATEMENT: Reports the sciatica is killing her, can't seem to get it under control. Reports the exercises help, but today the right side started hurting her really bad today. Notices her  balance is going well, but sometimes her R leg will get stuck to the floor. Feels like if she can get the sciatica under control, then everything else will be ok. Got injections, but report that it did not help. Notes the supine sciatic nerve glide kills her.    Pt accompanied by:  Daughter, Koleen Nimrod   PERTINENT HISTORY: osteoporosis, chronic back pain, newly diagnosed PD  PAIN:  Are you having pain? Yes: NPRS scale: 1/10 Pain location: Low back w/ RLE sciatica  Pain description: Achy/sharp  Aggravating factors: Not sure, has to watch how she steps Relieving factors:  Sleeping  PRECAUTIONS: Fall  RED FLAGS: Bowel or bladder incontinence: Yes: reports this started last year but hasn't gotten worse     WEIGHT BEARING RESTRICTIONS: No  FALLS: Has patient fallen in last 6 months? No  LIVING ENVIRONMENT: Lives with: lives with their family Lives in: House/apartment Stairs: Yes: Internal: full flight steps; on left going up and External: one steps; none Has following equipment at home: Grab bars  PLOF: Independent  PATIENT GOALS: "to be without pain and sleep a full night without waking up"   OBJECTIVE:  Note: Objective measures were completed at Evaluation unless otherwise noted.  DIAGNOSTIC FINDINGS: MRI of brain on 04/27/23   IMPRESSION: 1.  No evidence of an acute intracranial abnormality. 2. 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 3. Moderate multifocal T2 FLAIR hyperintense signal abnormality within the cerebral white matter, nonspecific but most often secondary to chronic small vessel ischemia. 4. Indeterminate calvarial lesions as described, one within the right frontal calvarium and the other within the left parietal calvarium. Although nonspecific, osseous metastases cannot be excluded. Correlate clinically and consider a short-interval follow-up brain MRI in 2 months to ensure stability. 5. Minimal left sphenoid and left  maxillary sinus mucosal thickening.  MRI of lumbar spine from 07/11/2021  IMPRESSION: 1. 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 2. Underlying moderate dextroscoliosis with additional moderate multilevel spondylosis at L1-2 through L4-5 as above. No other high-grade spinal stenosis. Moderate bilateral L3 and L4 foraminal narrowing. 3. 5.4 cm T2 hyperintense cystic lesion within the right adnexa, partially visualized, and not fully characterized on this exam. Follow-up examination with dedicated pelvic ultrasound recommended for further evaluation.  COGNITION: Overall cognitive status: Within functional limits for tasks assessed   SENSATION: Pt denies numbness/tingling in BLEs    POSTURE: rounded shoulders and forward head, resting tremor on LUE   LOWER EXTREMITY ROM:     Active  Right Eval Left Eval  Hip flexion P!   Hip extension    Hip abduction    Hip adduction    Hip internal rotation    Hip external rotation    Knee flexion    Knee extension -10 degrees (pain)   Ankle dorsiflexion    Ankle plantarflexion    Ankle inversion    Ankle eversion     (Blank rows = not tested)  LOWER EXTREMITY MMT:  Tested in seated position   MMT Right Eval Left Eval  Hip flexion 4- (pain)  4+  Hip extension    Hip abduction 5 5  Hip adduction 5 5  Hip internal rotation    Hip external rotation    Knee flexion 4- 4+  Knee extension 4- 4 (pain)  Ankle dorsiflexion 5 5  Ankle plantarflexion    Ankle inversion    Ankle eversion    (Blank rows = not tested)  BED MOBILITY:  Independent per pt   TRANSFERS: Assistive device utilized: None  Sit to stand: Modified independence Stand to sit: Modified independence No UE support, Decreased anterior weight shift and noted bracing of BLEs against back of chair w/occasional retropulsion    VITALS  Vitals:   05/23/23 1327   BP: 120/68  Pulse: 71     TODAY'S TREATMENT:        Ther Act  Assessed vitals (see above) and WNL today    [facility] PT Assessment - 05/23/23 1329  Standardized Balance Assessment   Standardized Balance Assessment Mini-BESTest;Timed Up and Go Test      Mini-BESTest   Sit To Stand Normal: Comes to stand without use of hands and stabilizes independently.    Rise to Toes Normal: Stable for 3 s with maximum height.    Stand on one leg (left) Moderate: < 20 s   6.3, 8.9   Stand on one leg (right) Moderate: < 20 s   3.5, 7   Stand on one leg - lowest score 1    Compensatory Stepping Correction - Forward Normal: Recovers independently with a single, large step (second realignement is allowed).    Compensatory Stepping Correction - Backward Normal: Recovers independently with a single, large step    Compensatory Stepping Correction - Left Lateral Normal: Recovers independently with 1 step (crossover or lateral OK)    Compensatory Stepping Correction - Right Lateral Normal: Recovers independently with 1 step (crossover or lateral OK)    Stepping Corredtion Lateral - lowest score 2    Stance - Feet together, eyes open, firm surface  Normal: 30s    Stance - Feet together, eyes closed, foam surface  Moderate: < 30s   9 seconds   Incline - Eyes Closed Normal: Stands independently 30s and aligns with gravity    Change in Gait Speed Normal: Significantly changes walkling speed without imbalance    Walk with head turns - Horizontal Normal: performs head turns with no change in gait speed and good balance    Walk with pivot turns Normal: Turns with feet close FAST (< 3 steps) with good balance.    Step over obstacles Normal: Able to step over box with minimal change of gait speed and with good balance.    Timed UP & GO with Dual Task Moderate: Dual Task affects either counting OR walking (>10%) when compared to the TUG without Dual Task.   pt only able to name 2 numbers   Mini-BEST total score 25       Timed Up and Go Test   Normal TUG (seconds) 7    Cognitive TUG (seconds) 7.63              Access Code: [email or web address] URL: [email or web address] Date: 05/23/2023 Prepared by: Sherlie Ban  Exercises - Seated Piriformis Stretch  - 1 x daily - 7 x weekly - 2-3 sets - 60 second hold - Supine Sciatic Nerve Glide  - 1 x daily - 7 x weekly - 3 sets - 10 reps - Lower Trunk Rotation Stretch  - 1 x daily - 7 x weekly - 3 sets - 10 reps  Added below exercises to HEP:  - Standing Balance with Eyes Closed on Foam  - 1 x daily - 5 x weekly - 3 sets - 30 hold - Standing March  - 1 x daily - 5 x weekly - 1-2 sets - 10 reps - pt standing on foam pad for a compliant surface - Seated Sciatic Tensioner  - 1 x daily - 5 x weekly - 1-2 sets - 10 reps - gave it as an option instead of pt performing supine as pt reporting that was "killing" her, pt reports being able to tolerate in sitting better.  PATIENT EDUCATION: Education details: Results of miniBEST, additions to HEP  Person educated: Patient and Child(ren) Education method: Explanation, Demonstration, and Handouts Education comprehension: verbalized understanding, returned demonstration, and needs further education  HOME EXERCISE PROGRAM: Access Code: [email or web address] URL: [email or web address] Date: 05/23/2023 Prepared by: Sherlie Ban  Exercises - Seated Piriformis Stretch  - 1 x daily - 7 x weekly - 2-3 sets - 60 second hold - Supine Sciatic Nerve Glide  - 1 x daily - 7 x weekly - 3 sets - 10 reps - Lower Trunk Rotation Stretch  - 1 x daily - 7 x weekly - 3 sets - 10 reps - Standing Balance with Eyes Closed on Foam  - 1 x daily - 5 x weekly - 3 sets - 30 hold - Standing March  - 1 x daily - 5 x weekly - 1-2 sets - 10 reps - Seated Sciatic Tensioner  - 1 x daily - 5 x weekly - 1-2 sets -  10 reps  GOALS: Goals reviewed with patient? Yes  SHORT TERM GOALS: Target date: 06/06/2023   Pt will be independent with initial HEP for improved strength, balance, transfers and gait.  Baseline: not established on eval  Goal status: INITIAL  2.  MiniBest to be assessed and LTG updated  Baseline: 25/28 Goal status: MET  3.  Modified Oswestry to be assessed and LTG updated  Baseline:  Goal status: MET   LONG TERM GOALS: Target date: 07/04/2023   Pt will be independent with final HEP for improved strength, balance, transfers and gait.  Baseline:  Goal status: INITIAL  2. Pt will improve miniBEST to at least a 27/28 in order to demo decr fall risk.  Baseline: 25/28 Goal status: INITIAL  3.  Pt will score </= 11/20 on Modified Oswestry for improved functional mobility and reduced pain levels  Baseline: 20/50 Goal status: REVISED    ASSESSMENT:  CLINICAL IMPRESSION: Assessed the miniBEST today with pt scoring a 25/28, indicating pt is not at a fall risk. Pt challenged by [facility] on a compliant surface, SLS, and dual tasking. Remainder of session focused on adding balance tasks to HEP and giving another option for a seated sciatic nerve glide instead of one in supine. Pt reporting that she is able to tolerate that one better. Continue POC.    OBJECTIVE IMPAIRMENTS: Abnormal gait, decreased activity tolerance, decreased balance, decreased endurance, decreased knowledge of condition, decreased knowledge of use of DME, decreased mobility, difficulty walking, decreased ROM, decreased strength, decreased safety awareness, impaired perceived functional ability, impaired flexibility, impaired sensation, and pain  ACTIVITY LIMITATIONS: carrying, lifting, bending, squatting, stairs, hygiene/grooming, locomotion level, and caring for others  PARTICIPATION LIMITATIONS: meal prep, medication management, driving, shopping, community activity, and yard work  PERSONAL FACTORS: Age, Fitness,  Past/current experiences, Transportation, and 1-2 comorbidities: PD and RLE sciatica  are also affecting patient's functional outcome.   REHAB POTENTIAL: Good  CLINICAL DECISION MAKING: Evolving/moderate complexity  EVALUATION COMPLEXITY: Moderate  PLAN:  PT FREQUENCY: 1x/week  PT DURATION: 8 weeks  PLANNED INTERVENTIONS: 97164- PT Re-evaluation, 97110-Therapeutic exercises, 97530- Therapeutic activity, 97112- Neuromuscular re-education, 97535- Self Care, 46962- Manual therapy, [phone]- Gait training, [phone]- Aquatic Therapy, Patient/Family education, Balance training, Stair training, Dry Needling, Joint mobilization, and DME instructions  PLAN FOR NEXT SESSION: work on exercises for R sciatica, stretching of RLE , dual tasking, EC/compliant surfaces, SLS. Give PD specific exercises   Pt does not have transportation so cannot do PD community classes.    Neva Ramaswamy N  Dmarcus Decicco, PT, DPT 05/23/2023, 3:17 PM

## 2023-05-30 ENCOUNTER — Ambulatory Visit: Payer: Medicare Other | Admitting: Physical Therapy

## 2023-06-03 ENCOUNTER — Ambulatory Visit: Payer: Medicare Other | Attending: Neurology | Admitting: Physical Therapy

## 2023-06-03 VITALS — BP 144/83 | HR 67

## 2023-06-03 DIAGNOSIS — M5441 Lumbago with sciatica, right side: Secondary | ICD-10-CM | POA: Diagnosis not present

## 2023-06-03 DIAGNOSIS — R2689 Other abnormalities of gait and mobility: Secondary | ICD-10-CM | POA: Diagnosis not present

## 2023-06-03 DIAGNOSIS — R2681 Unsteadiness on feet: Secondary | ICD-10-CM | POA: Diagnosis not present

## 2023-06-03 DIAGNOSIS — M5459 Other low back pain: Secondary | ICD-10-CM | POA: Diagnosis not present

## 2023-06-03 DIAGNOSIS — G8929 Other chronic pain: Secondary | ICD-10-CM | POA: Insufficient documentation

## 2023-06-03 DIAGNOSIS — M6281 Muscle weakness (generalized): Secondary | ICD-10-CM | POA: Insufficient documentation

## 2023-06-03 NOTE — Therapy (Signed)
 OUTPATIENT PHYSICAL THERAPY NEURO TREATMENT   Patient Name: Janet Solomon MRN: [id number] DOB:Oct 12, 1955, 68 y.o., female Today's Date: 06/03/2023   PCP: Lucius Krabbe, NP  REFERRING PROVIDER: Evonnie Asberry RAMAN, DO  END OF SESSION:  PT End of Session - 06/03/23 1407     Visit Number 4    Number of Visits 9    Date for PT Re-Evaluation 07/18/23    Authorization Type [employer] Medicare    PT Start Time 1404    PT Stop Time 1442    PT Time Calculation (min) 38 min    Equipment Utilized During Treatment --    Activity Tolerance Patient tolerated treatment well    Behavior During Therapy WFL for tasks assessed/performed              Past Medical History:  Diagnosis Date   GERD (gastroesophageal reflux disease)    Several years   Sciatica    Past Surgical History:  Procedure Laterality Date   CESAREAN SECTION  1986: 1988   TUBAL LIGATION  1988   Patient Active Problem List   Diagnosis Date Noted   Vitamin D  insufficiency 04/13/2023   Secondary hyperparathyroidism, non-renal (HCC) 04/13/2023   Age-related osteoporosis without current pathological fracture 04/13/2023   Chronic sciatica of right side 06/19/2021    ONSET DATE: 04/05/2023 (referral)   REFERRING DIAG: G20.A1 (ICD-10-CM) - Parkinson's disease without dyskinesia or fluctuating manifestations (HCC)  THERAPY DIAG:  Other abnormalities of gait and mobility  Unsteadiness on feet  Chronic bilateral low back pain with right-sided sciatica  Muscle weakness (generalized)  Other low back pain  Rationale for Evaluation and Treatment: Rehabilitation  SUBJECTIVE:                                                                                                                                                                                             SUBJECTIVE STATEMENT: Pt reports being okay. Does her HEP when she can, feels like she is getting better. No falls    Pt accompanied by:  Daughter  in-law, Janet Solomon   PERTINENT HISTORY: osteoporosis, chronic back pain, newly diagnosed PD  PAIN:  Are you having pain? Yes: NPRS scale: 5/10 Pain location: Low back w/ RLE sciatica  Pain description: Achy/sharp  Aggravating factors: Not sure, has to watch how she steps Relieving factors: Sleeping  PRECAUTIONS: Fall  RED FLAGS: Bowel or bladder incontinence: Yes: reports this started last year but hasn't gotten worse     WEIGHT BEARING RESTRICTIONS: No  FALLS: Has patient fallen in last 6 months? No  LIVING ENVIRONMENT: Lives with: lives with their family Lives in:  House/apartment Stairs: Yes: Internal: full flight steps; on left going up and External: one steps; none Has following equipment at home: Grab bars  PLOF: Independent  PATIENT GOALS: to be without pain and sleep a full night without waking up   OBJECTIVE:  Note: Objective measures were completed at Evaluation unless otherwise noted.  DIAGNOSTIC FINDINGS: MRI of brain on 04/27/23   IMPRESSION: 1.  No evidence of an acute intracranial abnormality. 2. 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 3. Moderate multifocal T2 FLAIR hyperintense signal abnormality within the cerebral white matter, nonspecific but most often secondary to chronic small vessel ischemia. 4. Indeterminate calvarial lesions as described, one within the right frontal calvarium and the other within the left parietal calvarium. Although nonspecific, osseous metastases cannot be excluded. Correlate clinically and consider a short-interval follow-up brain MRI in 2 months to ensure stability. 5. Minimal left sphenoid and left maxillary sinus mucosal thickening.  MRI of lumbar spine from 07/11/2021  IMPRESSION: 1. 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 2. Underlying moderate dextroscoliosis with  additional moderate multilevel spondylosis at L1-2 through L4-5 as above. No other high-grade spinal stenosis. Moderate bilateral L3 and L4 foraminal narrowing. 3. 5.4 cm T2 hyperintense cystic lesion within the right adnexa, partially visualized, and not fully characterized on this exam. Follow-up examination with dedicated pelvic ultrasound recommended for further evaluation.  COGNITION: Overall cognitive status: Within functional limits for tasks assessed   SENSATION: Pt denies numbness/tingling in BLEs    POSTURE: rounded shoulders and forward head, resting tremor on LUE   LOWER EXTREMITY ROM:     Active  Right Eval Left Eval  Hip flexion P!   Hip extension    Hip abduction    Hip adduction    Hip internal rotation    Hip external rotation    Knee flexion    Knee extension -10 degrees (pain)   Ankle dorsiflexion    Ankle plantarflexion    Ankle inversion    Ankle eversion     (Blank rows = not tested)  LOWER EXTREMITY MMT:  Tested in seated position   MMT Right Eval Left Eval  Hip flexion 4- (pain)  4+  Hip extension    Hip abduction 5 5  Hip adduction 5 5  Hip internal rotation    Hip external rotation    Knee flexion 4- 4+  Knee extension 4- 4 (pain)  Ankle dorsiflexion 5 5  Ankle plantarflexion    Ankle inversion    Ankle eversion    (Blank rows = not tested)  BED MOBILITY:  Independent per pt   TRANSFERS: Assistive device utilized: None  Sit to stand: Modified independence Stand to sit: Modified independence No UE support, Decreased anterior weight shift and noted bracing of BLEs against back of chair w/occasional retropulsion    VITALS  Vitals:   06/03/23 1409  BP: (!) 144/83  Pulse: 67     TODAY'S TREATMENT:        Ther Act  Assessed vitals (see above) and WNL today  Supine hip distraction (straight leg), x2 minutes per side and then x2 minutes bilaterally, for improved pain modulation. Pt had some pain in RLE when distracting  LLE, but not pain w/bilateral distraction.   Ther Ex  SciFit multi-peaks level 7.5 for 8 minutes using BUE/BLEs for neural priming for reciprocal movement, dynamic cardiovascular warmup and increased amplitude  of stepping.  Seated large blue theraball roll outs, x5 minutes, for improved spinal mobility and pain modulation.  Supine modified iron crosses, x8 per side, for improved spinal and hip mobility. Pt intiially very limited on RLE  Seated windmills, x8 per side, for improved hip and spinal mobility. Pt most symptomatic when performing spinal extension reaching to L side.  Standing calf stretch at wall, x3 minutes per side.  Child's pose stretch, x3 minutes.    Pt rated pain as 5/10 following session                                                                                                                 PATIENT EDUCATION: Education details: Continue HEP Person educated: Patient and Child(ren) Education method: Explanation, Demonstration, and Handouts Education comprehension: verbalized understanding, returned demonstration, and needs further education  HOME EXERCISE PROGRAM: Access Code: [email or web address] URL: [email or web address] Date: 05/23/2023 Prepared by: Sheffield Senate  Exercises - Seated Piriformis Stretch  - 1 x daily - 7 x weekly - 2-3 sets - 60 second hold - Supine Sciatic Nerve Glide  - 1 x daily - 7 x weekly - 3 sets - 10 reps - Lower Trunk Rotation Stretch  - 1 x daily - 7 x weekly - 3 sets - 10 reps - Standing Balance with Eyes Closed on Foam  - 1 x daily - 5 x weekly - 3 sets - 30 hold - Standing March  - 1 x daily - 5 x weekly - 1-2 sets - 10 reps - Seated Sciatic Tensioner  - 1 x daily - 5 x weekly - 1-2 sets - 10 reps  GOALS: Goals reviewed with patient? Yes  SHORT TERM GOALS: Target date: 06/06/2023   Pt will be independent with initial HEP for improved strength, balance, transfers and gait.  Baseline: not established on eval  Goal  status: INITIAL  2.  MiniBest to be assessed and LTG updated  Baseline: 25/28 Goal status: MET  3.  Modified Oswestry to be assessed and LTG updated  Baseline:  Goal status: MET   LONG TERM GOALS: Target date: 07/04/2023   Pt will be independent with final HEP for improved strength, balance, transfers and gait.  Baseline:  Goal status: INITIAL  2. Pt will improve miniBEST to at least a 27/28 in order to demo decr fall risk.  Baseline: 25/28 Goal status: INITIAL  3.  Pt will score </= 11/20 on Modified Oswestry for improved functional mobility and reduced pain levels  Baseline: 20/50 Goal status: REVISED    ASSESSMENT:  CLINICAL IMPRESSION: Emphasis of skilled PT session on improved spinal mobility, pain modulation and endurance. Pt continues to be limited by RLE pain but reports improved pain and mobility w/her HEP. Pt denied increase in pain w/activity and reported most relief w/distraction. Continue POC.    OBJECTIVE IMPAIRMENTS: Abnormal gait, decreased activity tolerance, decreased balance, decreased endurance, decreased knowledge of condition, decreased knowledge of use of DME, decreased mobility, difficulty walking, decreased ROM, decreased strength, decreased safety awareness,  impaired perceived functional ability, impaired flexibility, impaired sensation, and pain  ACTIVITY LIMITATIONS: carrying, lifting, bending, squatting, stairs, hygiene/grooming, locomotion level, and caring for others  PARTICIPATION LIMITATIONS: meal prep, medication management, driving, shopping, community activity, and yard work  PERSONAL FACTORS: Age, Fitness, Past/current experiences, Transportation, and 1-2 comorbidities: PD and RLE sciatica  are also affecting patient's functional outcome.   REHAB POTENTIAL: Good  CLINICAL DECISION MAKING: Evolving/moderate complexity  EVALUATION COMPLEXITY: Moderate  PLAN:  PT FREQUENCY: 1x/week  PT DURATION: 8 weeks  PLANNED INTERVENTIONS:  97164- PT Re-evaluation, 97110-Therapeutic exercises, 97530- Therapeutic activity, 97112- Neuromuscular re-education, 97535- Self Care, 02859- Manual therapy, [phone]- Gait training, [phone]- Aquatic Therapy, Patient/Family education, Balance training, Stair training, Dry Needling, Joint mobilization, and DME instructions  PLAN FOR NEXT SESSION: work on exercises for R sciatica, stretching of RLE , dual tasking, EC/compliant surfaces, SLS. Give PD specific exercises   Pt does not have transportation so cannot do PD community classes.    Janet Solomon E Janet Solomon, PT, DPT 06/03/2023, 2:45 PM

## 2023-06-06 ENCOUNTER — Ambulatory Visit: Payer: Medicare Other | Admitting: Physical Therapy

## 2023-06-06 DIAGNOSIS — R2689 Other abnormalities of gait and mobility: Secondary | ICD-10-CM | POA: Diagnosis not present

## 2023-06-06 DIAGNOSIS — M5441 Lumbago with sciatica, right side: Secondary | ICD-10-CM | POA: Diagnosis not present

## 2023-06-06 DIAGNOSIS — M6281 Muscle weakness (generalized): Secondary | ICD-10-CM

## 2023-06-06 DIAGNOSIS — R2681 Unsteadiness on feet: Secondary | ICD-10-CM | POA: Diagnosis not present

## 2023-06-06 DIAGNOSIS — M5459 Other low back pain: Secondary | ICD-10-CM | POA: Diagnosis not present

## 2023-06-06 DIAGNOSIS — G8929 Other chronic pain: Secondary | ICD-10-CM

## 2023-06-06 NOTE — Therapy (Signed)
 OUTPATIENT PHYSICAL THERAPY NEURO TREATMENT   Patient Name: Janet Solomon MRN: [id number] DOB:1955-09-19, 68 y.o., female Today's Date: 06/06/2023   PCP: Lucius Krabbe, NP  REFERRING PROVIDER: Evonnie Asberry RAMAN, DO  END OF SESSION:  PT End of Session - 06/06/23 1233     Visit Number 5    Number of Visits 9    Date for PT Re-Evaluation 07/18/23    Authorization Type [employer] Medicare    PT Start Time 1232    PT Stop Time 1319    PT Time Calculation (min) 47 min    Activity Tolerance Patient tolerated treatment well    Behavior During Therapy WFL for tasks assessed/performed               Past Medical History:  Diagnosis Date   GERD (gastroesophageal reflux disease)    Several years   Sciatica    Past Surgical History:  Procedure Laterality Date   CESAREAN SECTION  1986: 1988   TUBAL LIGATION  1988   Patient Active Problem List   Diagnosis Date Noted   Vitamin D  insufficiency 04/13/2023   Secondary hyperparathyroidism, non-renal (HCC) 04/13/2023   Age-related osteoporosis without current pathological fracture 04/13/2023   Chronic sciatica of right side 06/19/2021    ONSET DATE: 04/05/2023 (referral)   REFERRING DIAG: G20.A1 (ICD-10-CM) - Parkinson's disease without dyskinesia or fluctuating manifestations (HCC)  THERAPY DIAG:  Other abnormalities of gait and mobility  Unsteadiness on feet  Chronic bilateral low back pain with right-sided sciatica  Muscle weakness (generalized)  Rationale for Evaluation and Treatment: Rehabilitation  SUBJECTIVE:                                                                                                                                                                                             SUBJECTIVE STATEMENT: Pt reports being okay. Felt good after she left Friday but then had severe pain all weekend. No falls.    Pt accompanied by:  Daughter in-law, [name]   PERTINENT HISTORY: osteoporosis,  chronic back pain, newly diagnosed PD  PAIN:  Are you having pain? Yes: NPRS scale: 4/10 Pain location: Low back w/ RLE sciatica  Pain description: Achy/sharp  Aggravating factors: Not sure, has to watch how she steps Relieving factors: Sleeping  PRECAUTIONS: Fall  RED FLAGS: Bowel or bladder incontinence: Yes: reports this started last year but hasn't gotten worse     WEIGHT BEARING RESTRICTIONS: No  FALLS: Has patient fallen in last 6 months? No  LIVING ENVIRONMENT: Lives with: lives with their family Lives in: House/apartment Stairs: Yes: Internal: full flight steps; on left going up  and External: one steps; none Has following equipment at home: Grab bars  PLOF: Independent  PATIENT GOALS: to be without pain and sleep a full night without waking up   OBJECTIVE:  Note: Objective measures were completed at Evaluation unless otherwise noted.  DIAGNOSTIC FINDINGS: MRI of brain on 04/27/23   IMPRESSION: 1.  No evidence of an acute intracranial abnormality. 2. 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 3. Moderate multifocal T2 FLAIR hyperintense signal abnormality within the cerebral white matter, nonspecific but most often secondary to chronic small vessel ischemia. 4. Indeterminate calvarial lesions as described, one within the right frontal calvarium and the other within the left parietal calvarium. Although nonspecific, osseous metastases cannot be excluded. Correlate clinically and consider a short-interval follow-up brain MRI in 2 months to ensure stability. 5. Minimal left sphenoid and left maxillary sinus mucosal thickening.  MRI of lumbar spine from 07/11/2021  IMPRESSION: 1. 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 2. Underlying moderate dextroscoliosis with additional moderate multilevel spondylosis at L1-2 through  L4-5 as above. No other high-grade spinal stenosis. Moderate bilateral L3 and L4 foraminal narrowing. 3. 5.4 cm T2 hyperintense cystic lesion within the right adnexa, partially visualized, and not fully characterized on this exam. Follow-up examination with dedicated pelvic ultrasound recommended for further evaluation.  COGNITION: Overall cognitive status: Within functional limits for tasks assessed   SENSATION: Pt denies numbness/tingling in BLEs    POSTURE: rounded shoulders and forward head, resting tremor on LUE   LOWER EXTREMITY ROM:     Active  Right Eval Left Eval  Hip flexion P!   Hip extension    Hip abduction    Hip adduction    Hip internal rotation    Hip external rotation    Knee flexion    Knee extension -10 degrees (pain)   Ankle dorsiflexion    Ankle plantarflexion    Ankle inversion    Ankle eversion     (Blank rows = not tested)  LOWER EXTREMITY MMT:  Tested in seated position   MMT Right Eval Left Eval  Hip flexion 4- (pain)  4+  Hip extension    Hip abduction 5 5  Hip adduction 5 5  Hip internal rotation    Hip external rotation    Knee flexion 4- 4+  Knee extension 4- 4 (pain)  Ankle dorsiflexion 5 5  Ankle plantarflexion    Ankle inversion    Ankle eversion    (Blank rows = not tested)  BED MOBILITY:  Independent per pt   TRANSFERS: Assistive device utilized: None  Sit to stand: Modified independence Stand to sit: Modified independence No UE support, Decreased anterior weight shift and noted bracing of BLEs against back of chair w/occasional retropulsion    VITALS  There were no vitals filed for this visit.    TODAY'S TREATMENT:        Ther Act  Discussed progress in PT thus far and pt in agreement that PT is not helping her pain. Reviewed most recent lumbar spine MRI (2023) w/pt and explained the impact of severe bilateral foraminal stenosis and spondylolisthesis at L5-S1 can have on her mobility. Upon chart review, pt  was to have surgical consult w/gynecology in 2023 that was not scheduled due to a potential neoplasm noted on lumbar MRI. Pt reports the Ob/Gyn she saw is no longer at that office, so she  never had a follow-up. Emphasized importance of pt reaching out to Ob/gyn and scheduling appointment as pt's pain seems to be beyond the scope of PT and therapy is unable to address PD deficits due to pt's pain. Pt verbalized agreement that her symptoms are worsening (urinary urgency and tenesmus) and PT is not helping her pain, so pt to reach out to schedule gynecology appointment and contact PCP.                                                                           PATIENT EDUCATION: Education details: see above Person educated: Patient and Child(ren) Education method: Explanation Education comprehension: verbalized understanding, returned demonstration, and needs further education  HOME EXERCISE PROGRAM: Access Code: [email or web address] URL: [email or web address] Date: 05/23/2023 Prepared by: Sheffield Senate  Exercises - Seated Piriformis Stretch  - 1 x daily - 7 x weekly - 2-3 sets - 60 second hold - Supine Sciatic Nerve Glide  - 1 x daily - 7 x weekly - 3 sets - 10 reps - Lower Trunk Rotation Stretch  - 1 x daily - 7 x weekly - 3 sets - 10 reps - Standing Balance with Eyes Closed on Foam  - 1 x daily - 5 x weekly - 3 sets - 30 hold - Standing March  - 1 x daily - 5 x weekly - 1-2 sets - 10 reps - Seated Sciatic Tensioner  - 1 x daily - 5 x weekly - 1-2 sets - 10 reps  GOALS: Goals reviewed with patient? Yes  SHORT TERM GOALS: Target date: 06/06/2023   Pt will be independent with initial HEP for improved strength, balance, transfers and gait.  Baseline: not established on eval  Goal status: INITIAL  2.  MiniBest to be assessed and LTG updated  Baseline: 25/28 Goal status: MET  3.  Modified Oswestry to be assessed and LTG updated  Baseline:  Goal status: MET   LONG TERM GOALS:  Target date: 07/04/2023   Pt will be independent with final HEP for improved strength, balance, transfers and gait.  Baseline:  Goal status: INITIAL  2. Pt will improve miniBEST to at least a 27/28 in order to demo decr fall risk.  Baseline: 25/28 Goal status: INITIAL  3.  Pt will score </= 11/20 on Modified Oswestry for improved functional mobility and reduced pain levels  Baseline: 20/50 Goal status: REVISED    ASSESSMENT:  CLINICAL IMPRESSION: Emphasis of skilled PT session on pt education regarding progress in PT and next steps. Pt continues to be limited by severe RLE pain that significantly limits her ADLs and has not improved w/PT. Upon chart review, noted pt was to have surgical consult w/gynecology and neurosurgery, but did not have these scheduled. Due to pt's red flag lumbar spine symptoms, encouraged her to reach out to PCP and Ob/gyn for follow-up, as therapy unable to address PD deficits at this time due to high pain levels. Continue POC.    OBJECTIVE IMPAIRMENTS: Abnormal gait, decreased activity tolerance, decreased balance, decreased endurance, decreased knowledge of condition, decreased knowledge of use of DME, decreased mobility, difficulty walking, decreased ROM, decreased strength, decreased safety awareness, impaired perceived functional ability, impaired flexibility, impaired sensation, and pain  ACTIVITY  LIMITATIONS: carrying, lifting, bending, squatting, stairs, hygiene/grooming, locomotion level, and caring for others  PARTICIPATION LIMITATIONS: meal prep, medication management, driving, shopping, community activity, and yard work  PERSONAL FACTORS: Age, Fitness, Past/current experiences, Transportation, and 1-2 comorbidities: PD and RLE sciatica  are also affecting patient's functional outcome.   REHAB POTENTIAL: Good  CLINICAL DECISION MAKING: Evolving/moderate complexity  EVALUATION COMPLEXITY: Moderate  PLAN:  PT FREQUENCY: 1x/week  PT DURATION:  8 weeks  PLANNED INTERVENTIONS: 97164- PT Re-evaluation, 97110-Therapeutic exercises, 97530- Therapeutic activity, 97112- Neuromuscular re-education, 97535- Self Care, 02859- Manual therapy, [phone]- Gait training, [phone]- Aquatic Therapy, Patient/Family education, Balance training, Stair training, Dry Needling, Joint mobilization, and DME instructions  PLAN FOR NEXT SESSION: Did she make appointment w/gynecologist? work on exercises for R sciatica, stretching of RLE , dual tasking, EC/compliant surfaces, SLS. Give PD specific exercises   Pt does not have transportation so cannot do PD community classes.    Yukiko Minnich E Elias Dennington, PT, DPT 06/06/2023, 1:19 PM

## 2023-06-08 ENCOUNTER — Ambulatory Visit (HOSPITAL_COMMUNITY): Payer: Medicare Other

## 2023-06-09 ENCOUNTER — Other Ambulatory Visit: Payer: Self-pay | Admitting: Physical Medicine and Rehabilitation

## 2023-06-13 ENCOUNTER — Encounter: Payer: Self-pay | Admitting: Physical Therapy

## 2023-06-13 ENCOUNTER — Ambulatory Visit: Payer: Medicare Other | Admitting: Physical Therapy

## 2023-06-13 DIAGNOSIS — M6281 Muscle weakness (generalized): Secondary | ICD-10-CM | POA: Diagnosis not present

## 2023-06-13 DIAGNOSIS — R2681 Unsteadiness on feet: Secondary | ICD-10-CM | POA: Diagnosis not present

## 2023-06-13 DIAGNOSIS — M5459 Other low back pain: Secondary | ICD-10-CM | POA: Diagnosis not present

## 2023-06-13 DIAGNOSIS — M5441 Lumbago with sciatica, right side: Secondary | ICD-10-CM | POA: Diagnosis not present

## 2023-06-13 DIAGNOSIS — G8929 Other chronic pain: Secondary | ICD-10-CM | POA: Diagnosis not present

## 2023-06-13 DIAGNOSIS — R2689 Other abnormalities of gait and mobility: Secondary | ICD-10-CM | POA: Diagnosis not present

## 2023-06-13 NOTE — Therapy (Signed)
OUTPATIENT PHYSICAL THERAPY NEURO TREATMENT/DISCHARGE SUMMARY   Patient Name: Janet Solomon MRN: [id number] DOB:31-Jul-1955, 68 y.o., female Today's Date: 06/13/2023   PCP: Dulce Sellar, NP  REFERRING PROVIDER: Vladimir Faster, DO  END OF SESSION:  PT End of Session - 06/13/23 1235     Visit Number 6    Number of Visits 9    Date for PT Re-Evaluation 07/18/23    Authorization Type [employer] Medicare    PT Start Time 1234    PT Stop Time 1313    PT Time Calculation (min) 39 min    Activity Tolerance Patient tolerated treatment well    Behavior During Therapy WFL for tasks assessed/performed               Past Medical History:  Diagnosis Date   GERD (gastroesophageal reflux disease)    Several years   Sciatica    Past Surgical History:  Procedure Laterality Date   CESAREAN SECTION  1986: 1988   TUBAL LIGATION  1988   Patient Active Problem List   Diagnosis Date Noted   Vitamin D insufficiency 04/13/2023   Secondary hyperparathyroidism, non-renal (HCC) 04/13/2023   Age-related osteoporosis without current pathological fracture 04/13/2023   Chronic sciatica of right side 06/19/2021    ONSET DATE: 04/05/2023 (referral)   REFERRING DIAG: G20.A1 (ICD-10-CM) - Parkinson's disease without dyskinesia or fluctuating manifestations (HCC)  THERAPY DIAG:  Other abnormalities of gait and mobility  Unsteadiness on feet  Muscle weakness (generalized)  Chronic bilateral low back pain with right-sided sciatica  Rationale for Evaluation and Treatment: Rehabilitation  SUBJECTIVE:                                                                                                                                                                                             SUBJECTIVE STATEMENT: Has been really tired lately, feeling the same. Notes back pain has been really bad recently. A couple of days before, her son had to help her up the stairs because her leg  could not move. Has an appt next month with pain management. Also has a gynecologist appt next month due to her mass. Has an appt with the neurosurgeon in April. Will be getting an MRI next month. Reports that she has noticed her left hand has been shaking as well. Have not been able to go to sleep because unable to be comfortable due to her back. Has not been able to do her HEP at home due to incr back pain.    Pt accompanied by:  Daughter in-law, Koleen Nimrod   PERTINENT HISTORY: osteoporosis, chronic back pain, newly diagnosed  PD  PAIN:  Are you having pain? Yes: NPRS scale: 4/10 Pain location: Low back w/ RLE sciatica  Pain description: Achy/sharp  Aggravating factors: Not sure, has to watch how she steps Relieving factors: Sleeping  PRECAUTIONS: Fall  RED FLAGS: Bowel or bladder incontinence: Yes: reports this started last year but hasn't gotten worse     WEIGHT BEARING RESTRICTIONS: No  FALLS: Has patient fallen in last 6 months? No  LIVING ENVIRONMENT: Lives with: lives with their family Lives in: House/apartment Stairs: Yes: Internal: full flight steps; on left going up and External: one steps; none Has following equipment at home: Grab bars  PLOF: Independent  PATIENT GOALS: "to be without pain and sleep a full night without waking up"   OBJECTIVE:  Note: Objective measures were completed at Evaluation unless otherwise noted.  DIAGNOSTIC FINDINGS: MRI of brain on 04/27/23   IMPRESSION: 1.  No evidence of an acute intracranial abnormality. 2. 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 3. Moderate multifocal T2 FLAIR hyperintense signal abnormality within the cerebral white matter, nonspecific but most often secondary to chronic small vessel ischemia. 4. Indeterminate calvarial lesions as described, one within the right frontal calvarium and the other within the left parietal calvarium. Although nonspecific, osseous metastases  cannot be excluded. Correlate clinically and consider a short-interval follow-up brain MRI in 2 months to ensure stability. 5. Minimal left sphenoid and left maxillary sinus mucosal thickening.  MRI of lumbar spine from 07/11/2021  IMPRESSION: 1. 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 2. Underlying moderate dextroscoliosis with additional moderate multilevel spondylosis at L1-2 through L4-5 as above. No other high-grade spinal stenosis. Moderate bilateral L3 and L4 foraminal narrowing. 3. 5.4 cm T2 hyperintense cystic lesion within the right adnexa, partially visualized, and not fully characterized on this exam. Follow-up examination with dedicated pelvic ultrasound recommended for further evaluation.  COGNITION: Overall cognitive status: Within functional limits for tasks assessed   SENSATION: Pt denies numbness/tingling in BLEs    POSTURE: rounded shoulders and forward head, resting tremor on LUE   LOWER EXTREMITY ROM:     Active  Right Eval Left Eval  Hip flexion P!   Hip extension    Hip abduction    Hip adduction    Hip internal rotation    Hip external rotation    Knee flexion    Knee extension -10 degrees (pain)   Ankle dorsiflexion    Ankle plantarflexion    Ankle inversion    Ankle eversion     (Blank rows = not tested)  LOWER EXTREMITY MMT:  Tested in seated position   MMT Right Eval Left Eval  Hip flexion 4- (pain)  4+  Hip extension    Hip abduction 5 5  Hip adduction 5 5  Hip internal rotation    Hip external rotation    Knee flexion 4- 4+  Knee extension 4- 4 (pain)  Ankle dorsiflexion 5 5  Ankle plantarflexion    Ankle inversion    Ankle eversion    (Blank rows = not tested)  BED MOBILITY:  Independent per pt   TRANSFERS: Assistive device utilized: None  Sit to stand: Modified independence Stand to sit: Modified  independence No UE support, Decreased anterior weight shift and noted bracing of BLEs against back of chair w/occasional retropulsion    VITALS  There were no vitals filed for this visit.  TODAY'S TREATMENT:        Ther Act  Discussed progress in PT thus far and pt in agreement that PT is not helping her pain. Pt's pain seems to be beyond the scope of PT and therapy is unable to address PD deficits due back pain. Discussed going on hold for a few months while patient follows up with pain management, neurosurgery, and also her Ob/Gyn for potential neoplasm located on lumbar MRI. Discussed will get pt scheduled for PD screens in 6 months as a follow-up. Pt in agreement with plan.  Gave pt PD community resources (pt is limited in community exercise classes due to transportation), but showed online resources and online exercise classes for pt to perform as able.   Ther Ex  SciFit multi-peaks level 5.0 for 8 minutes using BUE/BLEs for neural priming for reciprocal movement, dynamic cardiovascular warmup and increased amplitude of stepping. Pt reporting RPE as a 7/10   NMR Pt performs PWR! Moves in Standing position   PWR! Up for improved posture 10 reps, plus an additional 10 reps raising onto toes for a balance challenge, needing to tap chair anteriorly as needed for balance   PWR! Rock for improved weight shifting 10 reps, an additional 10 reps with lifting up contralateral leg for dynamic SLS  PWR! Twist for improved trunk rotation 20 reps   PWR! Step for improved step initiation 20 reps with cues to pick up feet   Cues provided for technique and larger amplitude.        PHYSICAL THERAPY DISCHARGE SUMMARY  Visits from Start of Care: 6  Current functional level related to goals / functional outcomes: See clinical assessment statement.    Remaining deficits: Low back pain with RLE sciatica and decr activity tolerance due to back pain, tremor of LUE   Education /  Equipment: HEP, community resources for PD   Patient agrees to discharge. Patient goals were not met. Patient is being discharged due to  pt's back pain current limiting pt's ability to participate in PT for PD and pt not responding well to therapy for back pain. Pt to follow-up with pain management, neurosurgeon for further medical management. Will get pt scheduled for PD screens in 6 months.                                                                         PATIENT EDUCATION: Education details: See therapeutic activity section above. Continue HEP as able and gave standing PWR moves to HEP for PD for pt to perform when she is able.  Person educated: Patient and Child(ren) Education method: Explanation, Demonstration, Verbal cues, and Handouts Education comprehension: verbalized understanding, returned demonstration, and needs further education  HOME EXERCISE PROGRAM: Standing PWR moves   Access Code: [email or web address] URL: [email or web address] Date: 05/23/2023 Prepared by: Sherlie Ban  Exercises - Seated Piriformis Stretch  - 1 x daily - 7 x weekly - 2-3 sets - 60 second hold - Supine Sciatic Nerve Glide  - 1 x daily - 7 x weekly - 3 sets - 10 reps - Lower Trunk Rotation Stretch  - 1 x daily - 7 x weekly - 3 sets - 10 reps - Standing Balance with Eyes Closed on Foam  -  1 x daily - 5 x weekly - 3 sets - 30 hold - Standing March  - 1 x daily - 5 x weekly - 1-2 sets - 10 reps - Seated Sciatic Tensioner  - 1 x daily - 5 x weekly - 1-2 sets - 10 reps  GOALS: Goals reviewed with patient? Yes  SHORT TERM GOALS: Target date: 06/06/2023   Pt will be independent with initial HEP for improved strength, balance, transfers and gait.  Baseline: not established on eval  Goal status: INITIAL  2.  MiniBest to be assessed and LTG updated  Baseline: 25/28 Goal status: MET  3.  Modified Oswestry to be assessed and LTG updated  Baseline:  Goal status: MET   LONG TERM GOALS:  Target date: 07/04/2023   Pt will be independent with final HEP for improved strength, balance, transfers and gait.  Baseline:  Goal status: INITIAL  2. Pt will improve miniBEST to at least a 27/28 in order to demo decr fall risk.  Baseline: 25/28 Goal status: INITIAL  3.  Pt will score </= 11/20 on Modified Oswestry for improved functional mobility and reduced pain levels  Baseline: 20/50 Goal status: REVISED    ASSESSMENT:  CLINICAL IMPRESSION:  Pt continues with severe RLE and back pain that limits her ADLs/functional mobility and has not improved with PT. Unable to perform PD specific PT due to pt's pain. Pt has follow-up with Ob/gyn, neurosurgery, and pain management in the next couple of months. Discussed will discharged pt from PT at this time until pt has further medical management of her pain and will schedule pt for PD screens in 6 months as a follow-up. Pt in agreement of this plan. Provided pt with standing PWR moves for HEP if pt is able to perform when pain is less. Pt also has other exercises to perform for balance/low back as able. Pt will be discharged at this time in order for pt's pain to be further medically managed as unable to address PD deficits. Pt in agreement.    OBJECTIVE IMPAIRMENTS: Abnormal gait, decreased activity tolerance, decreased balance, decreased endurance, decreased knowledge of condition, decreased knowledge of use of DME, decreased mobility, difficulty walking, decreased ROM, decreased strength, decreased safety awareness, impaired perceived functional ability, impaired flexibility, impaired sensation, and pain  ACTIVITY LIMITATIONS: carrying, lifting, bending, squatting, stairs, hygiene/grooming, locomotion level, and caring for others  PARTICIPATION LIMITATIONS: meal prep, medication management, driving, shopping, community activity, and yard work  PERSONAL FACTORS: Age, Fitness, Past/current experiences, Transportation, and 1-2 comorbidities:  PD and RLE sciatica  are also affecting patient's functional outcome.   REHAB POTENTIAL: Good  CLINICAL DECISION MAKING: Evolving/moderate complexity  EVALUATION COMPLEXITY: Moderate  PLAN:  PT FREQUENCY: 1x/week  PT DURATION: 8 weeks  PLANNED INTERVENTIONS: 97164- PT Re-evaluation, 97110-Therapeutic exercises, 97530- Therapeutic activity, [id number]- Neuromuscular re-education, 97535- Self Care, 52841- Manual therapy, [phone]- Gait training, [phone]- Aquatic Therapy, Patient/Family education, Balance training, Stair training, Dry Needling, Joint mobilization, and DME instructions  PLAN FOR NEXT SESSION: D/C from PT    Drake Leach, PT, DPT 06/13/2023, 1:18 PM

## 2023-06-20 ENCOUNTER — Ambulatory Visit: Payer: Medicare Other | Admitting: Physical Therapy

## 2023-06-22 ENCOUNTER — Telehealth: Payer: Self-pay | Admitting: Internal Medicine

## 2023-06-22 NOTE — Telephone Encounter (Signed)
Left patient a vm regarding upcoming appointment

## 2023-06-24 IMAGING — US US PELVIS COMPLETE WITH TRANSVAGINAL
1 series · 13 of 25 positions shown · non-contrast
Comparison: MR lumbar spine 07/11/2021

CLINICAL DATA: Cystic lesion RIGHT adnexa, follow-up abnormal MRI,
postmenopausal



[Series 1: us pelvis complete with transvaginal · 0.21mm/px · 13 of 62 slices shown]
[im 1/62]
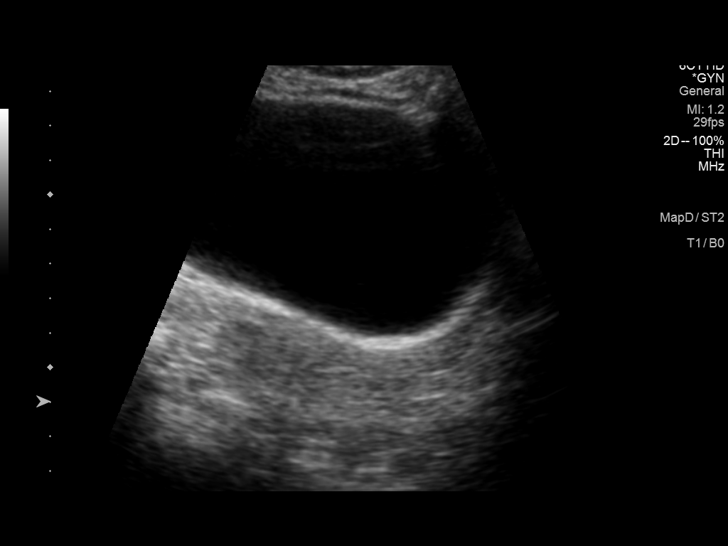
[im 6/62]
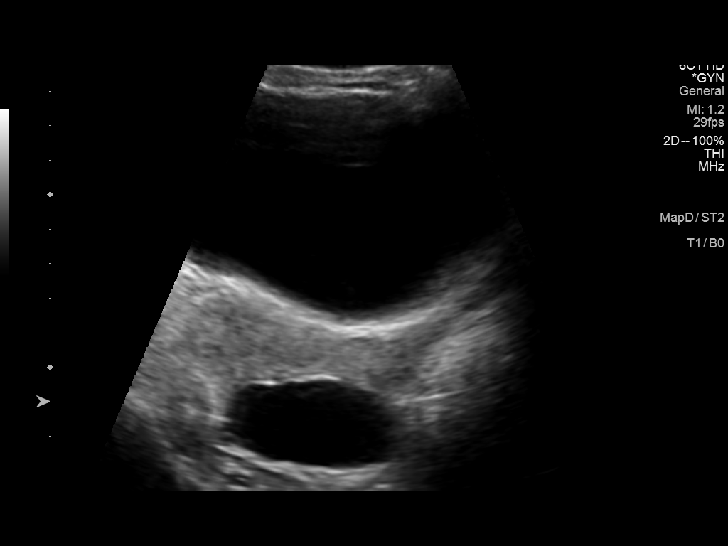
[im 11/62]
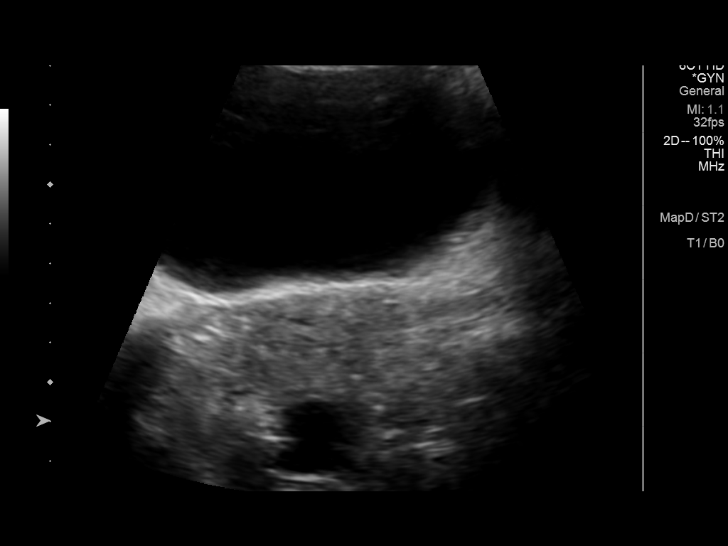
[im 16/62]
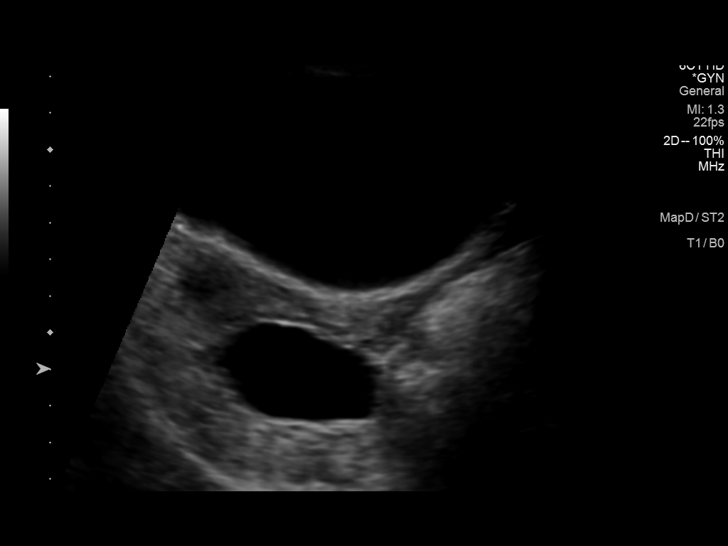
[im 21/62]
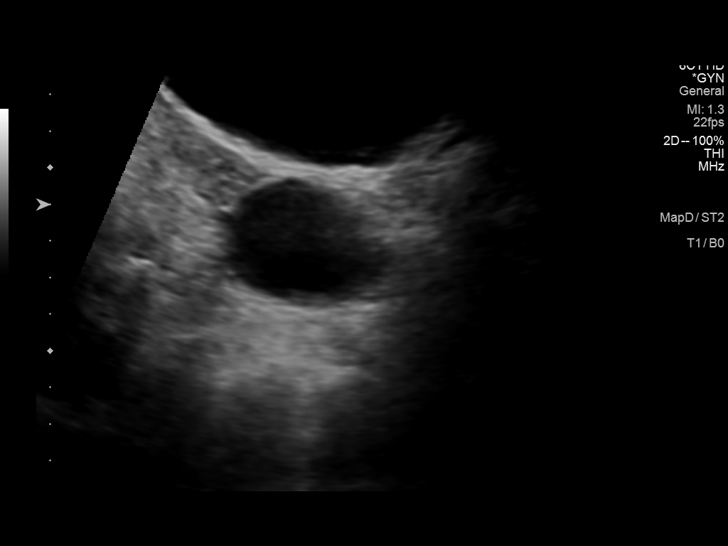
[im 26/62]
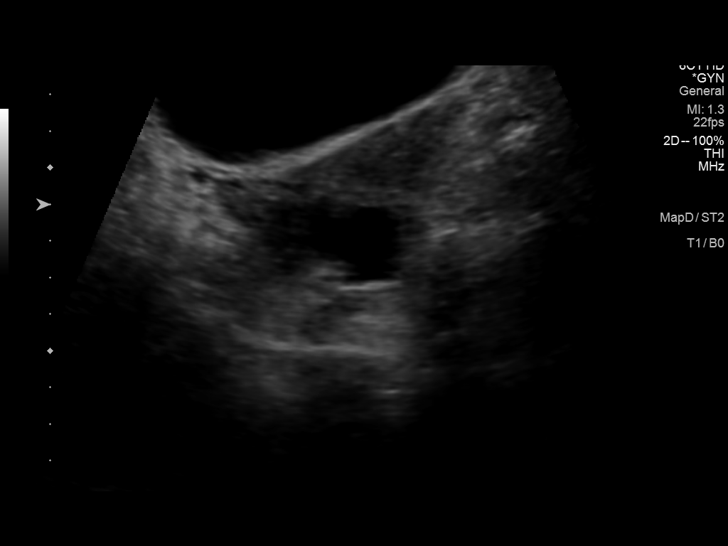
[im 31/62]
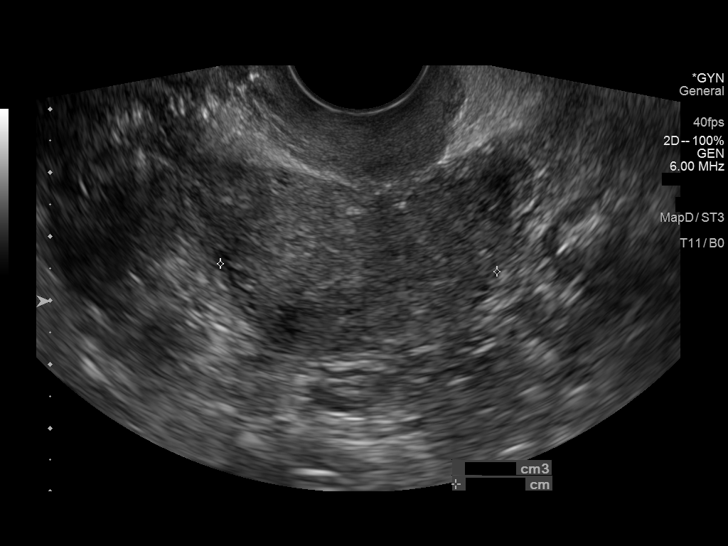
[im 36/62]
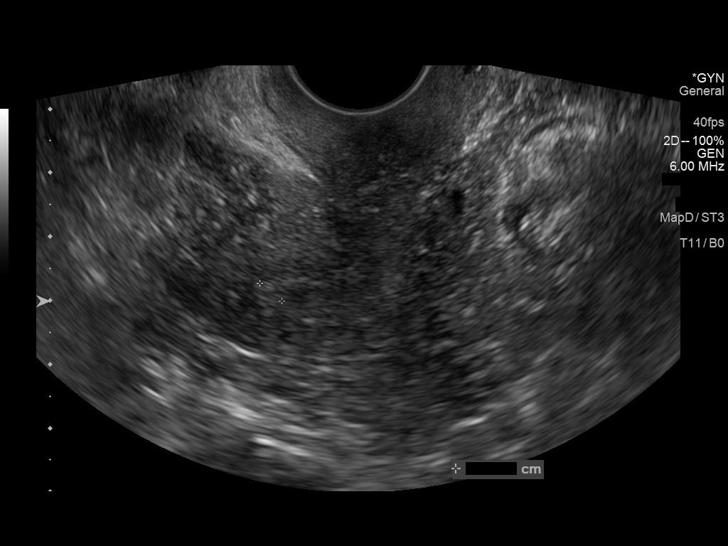
[im 41/62]
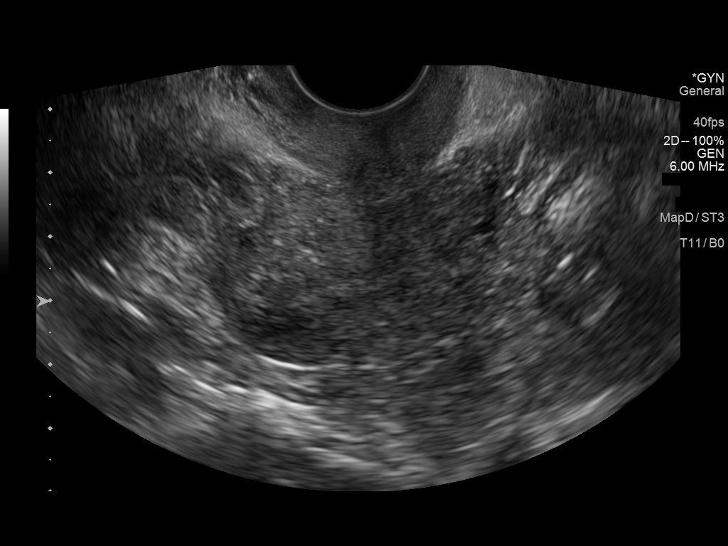
[im 46/62]
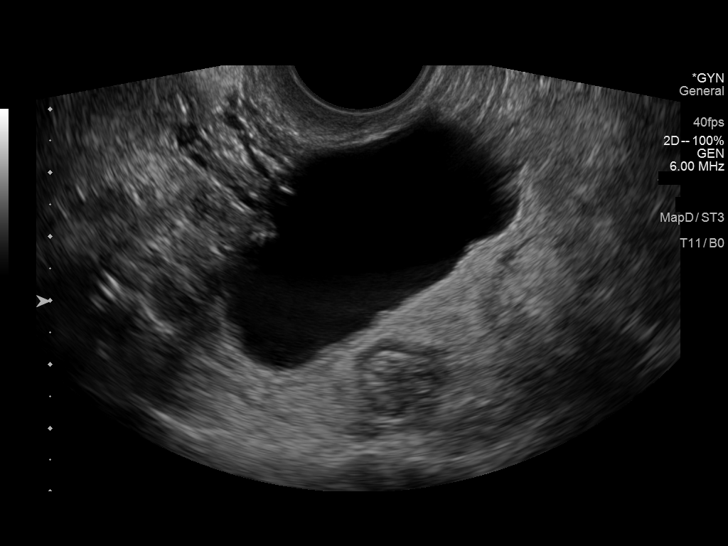
[im 51/62]
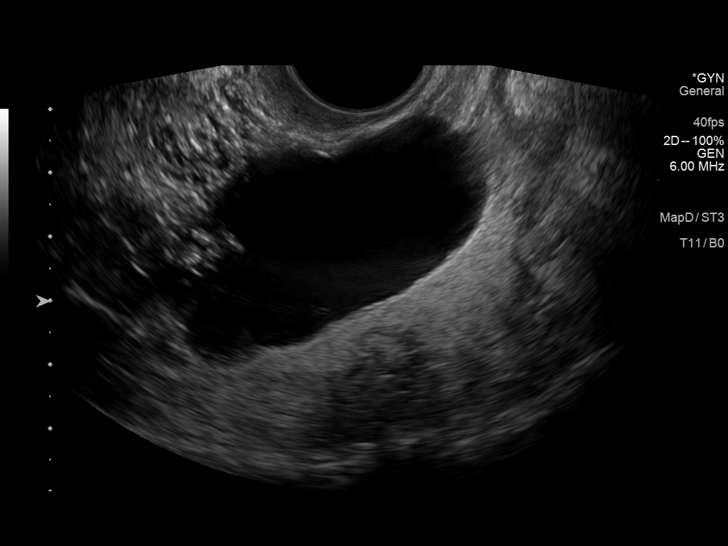
[im 56/62]
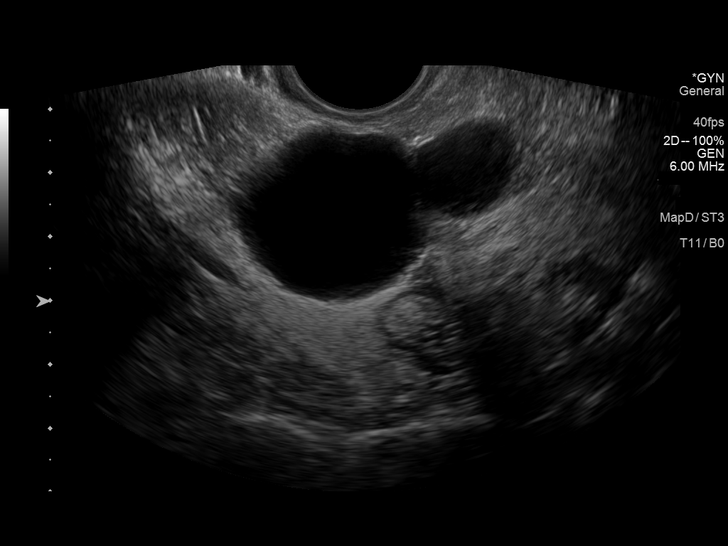
[im 62/62]
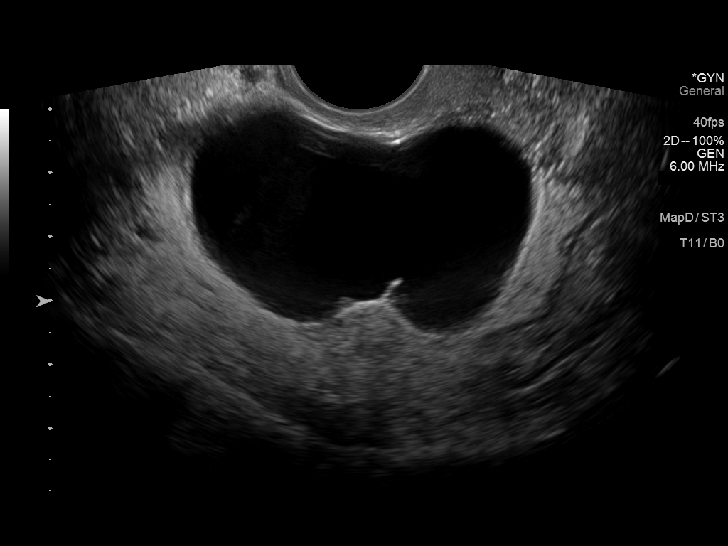

[13 of 25 positions shown; findings below may reference images not displayed]

FINDINGS: Uterus

Measurements: 5.0 x 2.7 x 4.3 cm = volume: 30 mL. Anteverted,
atrophic. No focal mass.

Endometrium

Thickness: 4 mm.  No endometrial fluid or mass

Right ovary

No normal appearing RIGHT ovary visualized, see below

Left ovary

Not visualized, likely obscured by bowel

Other findings

No free pelvic fluid. Cystic structure identified within the RIGHT
adnexa containing partial septations and minimal scattered internal
echogenicity, overall measuring 5.0 x 3.7 x 5.5 cm. Superior wall is
slightly irregular. This represents an indeterminate cystic lesion.
No additional masses.
IMPRESSION: Unremarkable uterus and endometrial complex with nonvisualization of
ovaries.

5.0 x 3.7 x 5.5 cm diameter complex cystic mass in the RIGHT adnexa,
could be of ovarian or paraovarian origin and is indeterminate;
based on lesion size and age, surgical evaluation is recommended to
exclude cystic ovarian neoplasm.

These results will be called to the ordering clinician or
representative by the Radiologist Assistant, and communication
documented in the PACS or [REDACTED].

## 2023-06-27 ENCOUNTER — Ambulatory Visit: Payer: Medicare Other | Admitting: Physical Therapy

## 2023-07-04 ENCOUNTER — Ambulatory Visit: Payer: Medicare Other | Admitting: Physical Therapy

## 2023-07-07 ENCOUNTER — Ambulatory Visit (HOSPITAL_COMMUNITY)
Admission: RE | Admit: 2023-07-07 | Discharge: 2023-07-07 | Disposition: A | Discharge status: Home / Self Care | Payer: Medicare Other | Source: Ambulatory Visit | Attending: Internal Medicine | Admitting: Internal Medicine

## 2023-07-07 DIAGNOSIS — R9089 Other abnormal findings on diagnostic imaging of central nervous system: Secondary | ICD-10-CM | POA: Diagnosis not present

## 2023-07-07 DIAGNOSIS — I6782 Cerebral ischemia: Secondary | ICD-10-CM | POA: Diagnosis not present

## 2023-07-07 DIAGNOSIS — G939 Disorder of brain, unspecified: Secondary | ICD-10-CM | POA: Diagnosis not present

## 2023-07-07 MED ORDER — GADOBUTROL 1 MMOL/ML IV SOLN
6.0000 mL | Freq: Once | INTRAVENOUS | Status: AC | PRN
  Administered 2023-07-07: 6 mL via INTRAVENOUS

## 2023-07-08 ENCOUNTER — Ambulatory Visit (INDEPENDENT_AMBULATORY_CARE_PROVIDER_SITE_OTHER): Payer: Medicare Other | Admitting: Obstetrics and Gynecology

## 2023-07-08 ENCOUNTER — Encounter: Payer: Self-pay | Admitting: Obstetrics and Gynecology

## 2023-07-08 VITALS — BP 128/78 | HR 66

## 2023-07-08 DIAGNOSIS — Z1211 Encounter for screening for malignant neoplasm of colon: Secondary | ICD-10-CM

## 2023-07-08 DIAGNOSIS — E2839 Other primary ovarian failure: Secondary | ICD-10-CM

## 2023-07-08 DIAGNOSIS — N83201 Unspecified ovarian cyst, right side: Secondary | ICD-10-CM | POA: Diagnosis not present

## 2023-07-08 DIAGNOSIS — Z1231 Encounter for screening mammogram for malignant neoplasm of breast: Secondary | ICD-10-CM

## 2023-07-08 NOTE — Progress Notes (Signed)
[id number] (2 c/s). Divorced Other or two or more races Declined female here for evaluation of an adnexal mass.  Denies any pain. New diagnosis of parkinson's and has been busy with testing.  She had a MRI in 2/23 for evaluation of back pain and was incidentally noted to have: A 5.4 cm T2 hyperintense cystic lesion within the right adnexa, partially visualized, and not fully characterized on this exam. Follow-up examination with dedicated pelvic ultrasound recommended for further evaluation.  Narrative & Impression  CLINICAL DATA:  Cystic lesion RIGHT adnexa, follow-up abnormal MRI, postmenopausal   EXAM: TRANSABDOMINAL AND TRANSVAGINAL ULTRASOUND OF PELVIS   TECHNIQUE: Both transabdominal and transvaginal ultrasound examinations of the pelvis were performed. Transabdominal technique was performed for global imaging of the pelvis including uterus, ovaries, adnexal regions, and pelvic cul-de-sac. It was necessary to proceed with endovaginal exam following the transabdominal exam to visualize the uterus, endometrium, ovaries.   COMPARISON:  MR lumbar spine 07/11/2021   FINDINGS: Uterus   Measurements: 5.0 x 2.7 x 4.3 cm = volume: 30 mL. Anteverted, atrophic. No focal mass.   Endometrium   Thickness: 4 mm.  No endometrial fluid or mass   Right ovary   No normal appearing RIGHT ovary visualized, see below   Left ovary   Not visualized, likely obscured by bowel   Other findings   No free pelvic fluid. Cystic structure identified within the RIGHT adnexa containing partial septations and minimal scattered internal echogenicity, overall measuring 5.0 x 3.7 x 5.5 cm. Superior wall is slightly irregular. This represents an indeterminate cystic lesion. No additional masses.   IMPRESSION: Unremarkable uterus and endometrial complex with nonvisualization of ovaries.   5.0 x 3.7 x 5.5 cm diameter complex cystic mass in the RIGHT adnexa, could be of ovarian or paraovarian  origin and is indeterminate; based on lesion size and age, surgical evaluation is recommended to exclude cystic ovarian neoplasm.   These results will be called to the ordering clinician or representative by the Radiologist Assistant, and communication documented in the PACS or [employer].     Electronically Signed   By: Ulyses Southward M.D.   On: 07/24/2021 14:39     No pain, no vaginal bleeding.   She is having severe sciatica pain on the right. It hurts to sit, she feels it has contributed to her constipation. She typically has a BM 2-3 x a week, now down to 1-2 x a week. She doesn't feel like she empties.  She just had her second epidural last week.   Some urge incontinence, just started 3 weeks.  She is voiding frequently, voiding normal amounts, no pain.     Not sexually active.   No LMP recorded. Patient is postmenopausal.          Sexually active: No.  The current method of family planning is post menopausal status.    Exercising: No.  She is having lower back pain.  Smoker:  no  Health Maintenance: Pap:  long time ago per patient  History of abnormal Pap:  yes had cervical surgery in her 20's  MMG:  years ago has one ordered. Will only do if she can do MRI as it is painful. MRI order placed BMD:   none order placed Colonoscopy: years ago in [place], 8-10 years ago.   TDaP:  unsure  Gardasil: n/a  Past Medical History:  Diagnosis Date   GERD (gastroesophageal reflux disease)    Several years   Parkinson disease (HCC)  Sciatica    Past Surgical History:  Procedure Laterality Date   CESAREAN SECTION  1986: 1988   TUBAL LIGATION  1988   Social History   Socioeconomic History   Marital status: Divorced    Spouse name: Not on file   Number of children: Not on file   Years of education: Not on file   Highest education level: 12th grade  Occupational History   Occupation: retired    Comment: Conservation officer, nature  Tobacco Use   Smoking status: Former    Current  packs/day: 0.00    Average packs/day: 1.5 packs/day for 15.0 years (22.5 ttl pk-yrs)    Types: Cigarettes    Start date: 07/25/2002    Quit date: 07/24/2017    Years since quitting: 5.9   Smokeless tobacco: Never   Tobacco comments:    Quit!  Substance and Sexual Activity   Alcohol use: Never   Drug use: Never   Sexual activity: Not Currently    Birth control/protection: Post-menopausal  Other Topics Concern   Not on file  Social History Narrative   Right handed    Retired    Social Drivers of Corporate investment banker Strain: Medium Risk (03/30/2023)   Overall Financial Resource Strain (CARDIA)    Difficulty of Paying Living Expenses: Somewhat hard  Food Insecurity: Food Insecurity Present (03/23/2023)   Hunger Vital Sign    Worried About Running Out of Food in the Last Year: Sometimes true    Ran Out of Food in the Last Year: Sometimes true  Transportation Needs: Unmet Transportation Needs (04/04/2023)   PRAPARE - Administrator, Civil Service (Medical): Yes    Lack of Transportation (Non-Medical): Yes  Physical Activity: Inactive (03/23/2023)   Exercise Vital Sign    Days of Exercise per Week: 0 days    Minutes of Exercise per Session: 0 min  Stress: No Stress Concern Present (03/23/2023)   [employer] of Occupational Health - Occupational Stress Questionnaire    Feeling of Stress : Not at all  Social Connections: Moderately Isolated (03/23/2023)   Social Connection and Isolation Panel [NHANES]    Frequency of Communication with Friends and Family: More than three times a week    Frequency of Social Gatherings with Friends and Family: More than three times a week    Attends Religious Services: 1 to 4 times per year    Active Member of [employer] or Organizations: No    Attends Banker Meetings: Never    Marital Status: Divorced   OB History     Gravida  6   Para  6   Term  6   Preterm      AB      Living  6      SAB      IAB       Ectopic      Multiple      Live Births  6          No Known Allergies Blood pressure 128/78, pulse 66, SpO2 97%.  Abdomen: NT, ND, no guarding no rebound A/p PM 5.5cm complex ovarian mass, no pelvic pain  To get OVA1 testing done to assess risk for ovarian cancer. To return for annual exam and repeat PUS to evaluate for any changes.  Counseled on s/s of ovarian torsion and to return immediately with any concerns. Counseled on importance of breast cancer screening.  She states she will pay for MRI if not covered  but will not do a mammogram  Dr. Karma Greaser

## 2023-07-12 DIAGNOSIS — G4700 Insomnia, unspecified: Secondary | ICD-10-CM | POA: Diagnosis not present

## 2023-07-12 DIAGNOSIS — G894 Chronic pain syndrome: Secondary | ICD-10-CM | POA: Diagnosis not present

## 2023-07-12 DIAGNOSIS — M5416 Radiculopathy, lumbar region: Secondary | ICD-10-CM | POA: Diagnosis not present

## 2023-07-12 DIAGNOSIS — M47816 Spondylosis without myelopathy or radiculopathy, lumbar region: Secondary | ICD-10-CM | POA: Diagnosis not present

## 2023-07-12 DIAGNOSIS — Z79891 Long term (current) use of opiate analgesic: Secondary | ICD-10-CM | POA: Diagnosis not present

## 2023-07-14 ENCOUNTER — Telehealth: Payer: Self-pay

## 2023-07-14 LAB — OVARIAN MALIGNANCY RISK-ROMA
CA125: 10 U/mL (ref <35)
HE4, OVARIAN CANCER MONITORING: 60 pmol/L
ROMA Postmenopausal: 1.06 (ref <2.77)
ROMA Premenopausal: 1.08 (ref <1.31)

## 2023-07-14 NOTE — Telephone Encounter (Signed)
Pt LVM in triage line stating she had a question about upcoming appt.   Spoke w/ the pt and she reported that she doesn't think that she will be able to pay any OOP costs to combine her AEX w/ her recommended PUS. So, is inquiring which one the provider would feel she should have done first and then will r/s for other at another time.   Pt advised at the time of call that likely the PUS would be recommended to perform first then r/s AEX for later/convenient time for her but will also inquire with provider too, to confirm.  She voiced understanding/appreciation. Please advise. Thanks.

## 2023-07-15 ENCOUNTER — Encounter: Payer: Self-pay | Admitting: Obstetrics and Gynecology

## 2023-07-18 ENCOUNTER — Inpatient Hospital Stay: Payer: Medicare Other | Attending: Internal Medicine | Admitting: Internal Medicine

## 2023-07-18 VITALS — BP 147/83 | HR 75 | Temp 97.6°F | Resp 15 | Wt 140.1 lb

## 2023-07-18 DIAGNOSIS — R930 Abnormal findings on diagnostic imaging of skull and head, not elsewhere classified: Secondary | ICD-10-CM | POA: Insufficient documentation

## 2023-07-18 DIAGNOSIS — G20A1 Parkinson's disease without dyskinesia, without mention of fluctuations: Secondary | ICD-10-CM | POA: Diagnosis not present

## 2023-07-18 DIAGNOSIS — Z87891 Personal history of nicotine dependence: Secondary | ICD-10-CM | POA: Insufficient documentation

## 2023-07-18 DIAGNOSIS — R9089 Other abnormal findings on diagnostic imaging of central nervous system: Secondary | ICD-10-CM | POA: Insufficient documentation
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
## 2023-07-19 ENCOUNTER — Telehealth: Payer: Self-pay | Admitting: Internal Medicine

## 2023-07-19 NOTE — Telephone Encounter (Signed)
 Janet Solomon

## 2023-07-25 NOTE — Telephone Encounter (Signed)
 Per AL:  "Called the pt, no answer, had to LVM. However, have the apt slot w/ EB held in case the pt decides to use/keep OV following PUS."

## 2023-07-25 NOTE — Telephone Encounter (Signed)
 Per EB: "Let's get the ultrasound done first. Has a large cyst we need to follow in menopause.  Dr. Karma Greaser"  Pt reports that she spoke w/ someone just now in the office that told her that her AEX would not be covered on her insurance. So, her f/u appt after PUS was cancelled.   Will send them a msg back and inquire about f/u OV to discuss [place] results.

## 2023-07-25 NOTE — Telephone Encounter (Signed)
 Pt agreed to same day f/u appt for OV to go over PUS results. Encounter closed.

## 2023-07-26 ENCOUNTER — Ambulatory Visit (HOSPITAL_BASED_OUTPATIENT_CLINIC_OR_DEPARTMENT_OTHER)
Admission: RE | Admit: 2023-07-26 | Discharge: 2023-07-26 | Disposition: A | Discharge status: Home / Self Care | Payer: Medicare Other | Source: Ambulatory Visit | Attending: Obstetrics and Gynecology | Admitting: Obstetrics and Gynecology

## 2023-07-26 ENCOUNTER — Encounter: Payer: Self-pay | Admitting: Obstetrics and Gynecology

## 2023-07-26 DIAGNOSIS — E2839 Other primary ovarian failure: Secondary | ICD-10-CM | POA: Insufficient documentation

## 2023-07-26 DIAGNOSIS — Z780 Asymptomatic menopausal state: Secondary | ICD-10-CM | POA: Diagnosis not present

## 2023-07-26 DIAGNOSIS — M85832 Other specified disorders of bone density and structure, left forearm: Secondary | ICD-10-CM | POA: Diagnosis not present

## 2023-08-01 ENCOUNTER — Encounter: Payer: Self-pay | Admitting: Gastroenterology

## 2023-08-04 ENCOUNTER — Other Ambulatory Visit: Payer: Medicare Other | Admitting: Obstetrics and Gynecology

## 2023-08-04 ENCOUNTER — Other Ambulatory Visit: Admitting: Obstetrics and Gynecology

## 2023-08-04 ENCOUNTER — Other Ambulatory Visit: Payer: Medicare Other

## 2023-08-31 ENCOUNTER — Ambulatory Visit (INDEPENDENT_AMBULATORY_CARE_PROVIDER_SITE_OTHER)

## 2023-08-31 ENCOUNTER — Other Ambulatory Visit (HOSPITAL_COMMUNITY)
Admission: RE | Admit: 2023-08-31 | Discharge: 2023-08-31 | Disposition: A | Discharge status: Home / Self Care | Source: Ambulatory Visit | Attending: Obstetrics and Gynecology | Admitting: Obstetrics and Gynecology

## 2023-08-31 ENCOUNTER — Other Ambulatory Visit: Payer: Self-pay | Admitting: Obstetrics and Gynecology

## 2023-08-31 ENCOUNTER — Ambulatory Visit: Admitting: Obstetrics and Gynecology

## 2023-08-31 ENCOUNTER — Encounter: Payer: Self-pay | Admitting: Obstetrics and Gynecology

## 2023-08-31 VITALS — BP 114/64 | HR 78

## 2023-08-31 DIAGNOSIS — Z01818 Encounter for other preprocedural examination: Secondary | ICD-10-CM | POA: Diagnosis not present

## 2023-08-31 DIAGNOSIS — Z1231 Encounter for screening mammogram for malignant neoplasm of breast: Secondary | ICD-10-CM

## 2023-08-31 DIAGNOSIS — Z1211 Encounter for screening for malignant neoplasm of colon: Secondary | ICD-10-CM

## 2023-08-31 DIAGNOSIS — R52 Pain, unspecified: Secondary | ICD-10-CM

## 2023-08-31 DIAGNOSIS — N83201 Unspecified ovarian cyst, right side: Secondary | ICD-10-CM

## 2023-08-31 DIAGNOSIS — Z124 Encounter for screening for malignant neoplasm of cervix: Secondary | ICD-10-CM

## 2023-08-31 DIAGNOSIS — E2839 Other primary ovarian failure: Secondary | ICD-10-CM

## 2023-08-31 MED ORDER — ESTRADIOL 0.1 MG/GM VA CREA: 1.0000 | TOPICAL_CREAM | Freq: Every day | VAGINAL | 12 refills | Status: AC

## 2023-08-31 NOTE — Progress Notes (Signed)
 [id number] (2 c/s). Divorced Other or two or more races Declined female here for evaluation of an adnexal mass.  Has some mild RLQ pain does not require medication and can bend and move New diagnosis of parkinson's and has been busy with testing. Ova1 normal  She had a MRI in 2/23 for evaluation of back pain and was incidentally noted to have: A 5.4 cm T2 hyperintense cystic lesion within the right adnexa, partially visualized, and not fully characterized on this exam. Follow-up examination with dedicated pelvic ultrasound recommended for further evaluation.  Patient with osteoporosis -2.5 repeat bone scan at -2.4 on fosamax x1 year. High risk for fracture with parkinsons and having an unstable gait and not much improvement on fosamax. To try for Evenity/prolia.  Patient also with reflux on the medication. Counseled on the r/b/a/I of the medications. PA placed  Narrative & Impression  CLINICAL DATA:  Cystic lesion RIGHT adnexa, follow-up abnormal MRI, postmenopausal   EXAM: TRANSABDOMINAL AND TRANSVAGINAL ULTRASOUND OF PELVIS   TECHNIQUE: Both transabdominal and transvaginal ultrasound examinations of the pelvis were performed. Transabdominal technique was performed for global imaging of the pelvis including uterus, ovaries, adnexal regions, and pelvic cul-de-sac. It was necessary to proceed with endovaginal exam following the transabdominal exam to visualize the uterus, endometrium, ovaries.   COMPARISON:  MR lumbar spine 07/11/2021   FINDINGS: Uterus   Measurements: 5.0 x 2.7 x 4.3 cm = volume: 30 mL. Anteverted, atrophic. No focal mass.   Endometrium   Thickness: 4 mm.  No endometrial fluid or mass   Right ovary   No normal appearing RIGHT ovary visualized, see below   Left ovary   Not visualized, likely obscured by bowel   Other findings   No free pelvic fluid. Cystic structure identified within the RIGHT adnexa containing partial septations and minimal scattered  internal echogenicity, overall measuring 5.0 x 3.7 x 5.5 cm. Superior wall is slightly irregular. This represents an indeterminate cystic lesion. No additional masses.   IMPRESSION: Unremarkable uterus and endometrial complex with nonvisualization of ovaries.   5.0 x 3.7 x 5.5 cm diameter complex cystic mass in the RIGHT adnexa, could be of ovarian or paraovarian origin and is indeterminate; based on lesion size and age, surgical evaluation is recommended to exclude cystic ovarian neoplasm.   These results will be called to the ordering clinician or representative by the Radiologist Assistant, and communication documented in the PACS or [employer].     Electronically Signed   By: Ulyses Southward M.D.   On: 07/24/2021 14:39     No pain, no vaginal bleeding.   She is having severe sciatica pain on the right. It hurts to sit, she feels it has contributed to her constipation. She typically has a BM 2-3 x a week, now down to 1-2 x a week. She doesn't feel like she empties.  She just had her second epidural last week.   Some urge incontinence, just started 3 weeks.  She is voiding frequently, voiding normal amounts, no pain.     Not sexually active.   No LMP recorded. Patient is postmenopausal.          Sexually active: No.  The current method of family planning is post menopausal status.    Exercising: No.  She is having lower back pain.  Smoker:  no  Health Maintenance: Pap:  long time ago per patient  History of abnormal Pap:  yes had cervical surgery in her 20's  MMG:  years ago  has one ordered. Will only do if she can do MRI as it is painful. MRI order placed BMD:   none order placed Colonoscopy: years ago in [place], 8-10 years ago.   TDaP:  unsure  Gardasil: n/a  Denies any VB   Repeat PUS today with 5.85cm uterus Left ovary 1.60cm Right ovary 2.38cm with a 6.9cm x3.0 x 6.2cm avascular simple septated cyst is noted in the RT adnexa.  Hard to distinguish if it  is in the ovary vs. Paraovarian.   Has grown in size since last ultrasound on 07/24/21  Past Medical History:  Diagnosis Date   GERD (gastroesophageal reflux disease)    Several years   Parkinson disease (HCC)    Sciatica    Past Surgical History:  Procedure Laterality Date   CESAREAN SECTION  1986: 1988   TUBAL LIGATION  1988   Social History   Socioeconomic History   Marital status: Divorced    Spouse name: Not on file   Number of children: Not on file   Years of education: Not on file   Highest education level: 12th grade  Occupational History   Occupation: retired    Comment: Conservation officer, nature  Tobacco Use   Smoking status: Former    Current packs/day: 0.00    Average packs/day: 1.5 packs/day for 15.0 years (22.5 ttl pk-yrs)    Types: Cigarettes    Start date: 07/25/2002    Quit date: 07/24/2017    Years since quitting: 6.1   Smokeless tobacco: Never   Tobacco comments:    Quit!  Substance and Sexual Activity   Alcohol use: Never   Drug use: Never   Sexual activity: Not Currently    Partners: Male    Birth control/protection: Post-menopausal  Other Topics Concern   Not on file  Social History Narrative   Right handed    Retired    Social Drivers of Corporate investment banker Strain: Medium Risk (03/30/2023)   Overall Financial Resource Strain (CARDIA)    Difficulty of Paying Living Expenses: Somewhat hard  Food Insecurity: Food Insecurity Present (03/23/2023)   Hunger Vital Sign    Worried About Running Out of Food in the Last Year: Sometimes true    Ran Out of Food in the Last Year: Sometimes true  Transportation Needs: Unmet Transportation Needs (04/04/2023)   PRAPARE - Administrator, Civil Service (Medical): Yes    Lack of Transportation (Non-Medical): Yes  Physical Activity: Inactive (03/23/2023)   Exercise Vital Sign    Days of Exercise per Week: 0 days    Minutes of Exercise per Session: 0 min  Stress: No Stress Concern Present (03/23/2023)    [employer] of Occupational Health - Occupational Stress Questionnaire    Feeling of Stress : Not at all  Social Connections: Moderately Isolated (03/23/2023)   Social Connection and Isolation Panel [NHANES]    Frequency of Communication with Friends and Family: More than three times a week    Frequency of Social Gatherings with Friends and Family: More than three times a week    Attends Religious Services: 1 to 4 times per year    Active Member of [employer] or Organizations: No    Attends Banker Meetings: Never    Marital Status: Divorced   OB History     Gravida  6   Para  6   Term  6   Preterm      AB  Living  6      SAB      IAB      Ectopic      Multiple      Live Births  6          No Known Allergies Blood pressure 114/64, pulse 78, SpO2 97%. Sve: normal cervix, atrophic vagina PUS used for remaining exam  Abdomen: NT, ND, no guarding no rebound A/p PM enlarging 6cm complex ovarian mass, normal ova1 no pelvic pain Discussed results with patient. Concerned the cyst is growing. Discussed pathology is only way to determine 100% it is benign.  Concern for torsion expressed and the s/s of it.  Discussed controlled/planned surgery with RLH/BSO, washings and cysto vs. Emergency case in event of torsion.  Discussed the procedure in detail and importance of hysterectomy, incase the ovarian cells are not benign and associated and risk with the uterus.  To begin vaginal estrogen today to help with vaginal healing after surgery.   Patient agreed. Surgery request sent. Pap smear collected to have on chart for surgery.  Does not require EMB with no PMB and normal lining on ultrasound today. 30 minutes spent on reviewing records, imaging,  and one on one patient time and counseling patient and documentation Dr. Karma Greaser  Dr. Karma Greaser

## 2023-09-01 NOTE — Progress Notes (Unsigned)
 Assessment/Plan:   Parkinsons disease, diagnosed November, 2024             - Continue carbidopa/levodopa 25/100, 1 tablet 3 times per day  - Patient does have family history of Parkinson's disease (sister)             -continue Carbidopa/levodopa 25/100, 1 tablet 3 times per day  -We discussed that it used to be thought that levodopa would increase risk of melanoma but now it is believed that Parkinsons itself likely increases risk of melanoma. she is to get regular skin checks.     2.  chronic LBP             -limits want/ability to exercise  -follows with pain management   3.  Calvarial lesions of unclear etiology  - Following with Dr. Barbaraann Cao, but those lesions are stable.  They are going to do 1 additional MRI in the future. Subjective:   Janet Solomon was seen today in follow up for Parkinsons disease.  My previous records were reviewed prior to todays visit as well as outside records available to me. Pt with daughter in law who supplements hx.   Patient was started on levodopa since our last visit.  She is tolerating that medication well, without side effects.  She doesn't think that the medication helps.  She has had no falls.  She has had no near syncope.  She did have an MRI of the brain done since last visit.  This demonstrated moderate T2 hyperintensities.  However, it also demonstrated some calvarial lesions and metastatic lesions could not be ruled out.  She was subsequently sent to oncology.  They repeated the brain scan 2 months later and those lesions were stable.  She is seeing Dr. Barbaraann Cao.  She did go to a few episodes of physical therapy since our last visit and those notes are reviewed as well.  She states that she couldn't attend more because her sciatica got in the way.  She is not exercising.    Current prescribed movement disorder medications: Carbidopa/levodopa 25/100, 1 tablet 3 times per day.    ALLERGIES:  No Known Allergies  CURRENT  MEDICATIONS:  Current Meds  Medication Sig   alendronate (FOSAMAX) 70 MG tablet Take 1 tablet (70 mg total) by mouth every 7 (seven) days. Take with a full glass of water on an empty stomach.   carbidopa-levodopa (SINEMET IR) 25-100 MG tablet Take 1 tablet by mouth 3 (three) times daily. 10am/2pm/6pm   estradiol (ESTRACE VAGINAL) 0.1 MG/GM vaginal cream Place 1 Applicatorful vaginally at bedtime. Apply a dime size amount in the vagina nightly until surgery   Magnesium Bisglycinate (MAG GLYCINATE PO) Take by mouth.   pregabalin (LYRICA) 75 MG capsule TAKE 1 TABLET 75 MG BY MOUTH TWICE A DAY, MORNING AND BEDTIME. (Patient taking differently: Take 75 mg by mouth 3 (three) times daily. Take 1 tablet 75 mg by mouth twice a day, morning and bedtime.)   Vitamin D-Vitamin K (K2-D3 5000) 5000-90 UNIT-MCG CAPS Take by mouth.     Objective:   PHYSICAL EXAMINATION:    VITALS:   Vitals:   09/02/23 1300  BP: 124/76  Pulse: 76  SpO2: 94%  Weight: 142 lb (64.4 kg)  Height: 5' (1.524 m)    GEN:  The patient appears stated age and is in NAD. HEENT:  Normocephalic, atraumatic.  The mucous membranes are moist. The superficial temporal arteries are without ropiness or tenderness. CV:  RRR  Lungs:  CTAB Neck/HEME:  There are no carotid bruits bilaterally.  Neurological examination:  Orientation: The patient is alert and oriented x3. Cranial nerves: There is good facial symmetry with good facial hypomimia. The speech is fluent and clear. Soft palate rises symmetrically and there is no tongue deviation. Hearing is intact to conversational tone. Sensation: Sensation is intact to light touch throughout Motor: Strength is at least antigravity x4.  Movement examination: Tone: There is mild increased tone in the LUE/LLE. Abnormal movements: there is no significant tremor today (rare tremor felt on the L) Coordination:  There is no decremation with any form of RAMS, including alternating supination and  pronation of the forearm, hand opening and closing, finger taps, heel taps and toe taps.  Gait and Station: The patient has no difficulty arising out of a deep-seated chair without the use of the hands. The patient's stride length is good with improved arm swing today.      I have reviewed and interpreted the following labs independently    Chemistry      Component Value Date/Time   NA 141 04/13/2023 0848   K 4.3 04/13/2023 0848   CL 106 04/13/2023 0848   CO2 29 04/13/2023 0848   BUN 10 04/13/2023 0848   CREATININE 0.92 04/13/2023 0848      Component Value Date/Time   CALCIUM 9.1 04/13/2023 0848   ALKPHOS 76 04/07/2022 1039   AST 20 04/07/2022 1039   ALT 12 04/07/2022 1039   BILITOT 0.8 04/07/2022 1039       Lab Results  Component Value Date   WBC 11.1 (H) 04/07/2022   HGB 12.4 04/07/2022   HCT 38.1 04/07/2022   MCV 85.3 04/07/2022   PLT 289.0 04/07/2022    Lab Results  Component Value Date   TSH 2.57 09/28/2022     Total time spent on today's visit was 30 minutes, including both face-to-face time and nonface-to-face time.  Time included that spent on review of records (prior notes available to me/labs/imaging if pertinent), discussing treatment and goals, answering patient's questions and coordinating care.  Cc:  Dulce Sellar, NP

## 2023-09-02 ENCOUNTER — Encounter: Payer: Self-pay | Admitting: Neurology

## 2023-09-02 ENCOUNTER — Ambulatory Visit: Payer: Medicare Other | Admitting: Neurology

## 2023-09-02 VITALS — BP 124/76 | HR 76 | Ht 60.0 in | Wt 142.0 lb

## 2023-09-02 DIAGNOSIS — G20A1 Parkinson's disease without dyskinesia, without mention of fluctuations: Secondary | ICD-10-CM

## 2023-09-02 MED ORDER — CARBIDOPA-LEVODOPA 25-100 MG PO TABS: 1.0000 | ORAL_TABLET | Freq: Three times a day (TID) | ORAL | 1 refills | Status: DC

## 2023-09-02 NOTE — Patient Instructions (Signed)
 As we discussed, it used to be thought that levodopa would increase risk of melanoma but now it is believed that Parkinsons itself likely increases risk of melanoma. I recommend yearly skin checks with a board certified dermatologist.    [facility] Locations: [facility] [address] [place], [place] [address] [phone]  [facility] Address: [address] [place], [place], [place] [address] Phone: [phone]  [facility] [address] [place], [place], [place] Phone: [phone]  [facility] [address], [place], [place] [address] Phone: [phone]  [place]: [facility] [address], [place], [place] [address] Phone: [phone]  [facility] [address] [place], [place], [place] [address] Phone: [phone]  [place]: [facility] [address] [place], [place] [address] Phone: [phone]

## 2023-09-06 DIAGNOSIS — G894 Chronic pain syndrome: Secondary | ICD-10-CM | POA: Diagnosis not present

## 2023-09-06 DIAGNOSIS — M47816 Spondylosis without myelopathy or radiculopathy, lumbar region: Secondary | ICD-10-CM | POA: Diagnosis not present

## 2023-09-06 DIAGNOSIS — M5416 Radiculopathy, lumbar region: Secondary | ICD-10-CM | POA: Diagnosis not present

## 2023-09-06 DIAGNOSIS — G4700 Insomnia, unspecified: Secondary | ICD-10-CM | POA: Diagnosis not present

## 2023-09-08 ENCOUNTER — Encounter: Payer: Self-pay | Admitting: Obstetrics and Gynecology

## 2023-09-08 LAB — CYTOLOGY - PAP: Diagnosis: NEGATIVE

## 2023-09-12 ENCOUNTER — Ambulatory Visit (HOSPITAL_COMMUNITY)

## 2023-09-13 ENCOUNTER — Telehealth: Payer: Self-pay | Admitting: *Deleted

## 2023-09-13 NOTE — Telephone Encounter (Signed)
-----   Message from Reinaldo Caras sent at 08/31/2023 11:36 AM EDT -----  Patient with osteoporosis -2.5 repeat bone scan at -2.4 on fosamax  x1 year. High risk for fracture with parkinsons and having an unstable gait and not much improvement on fosamax . To try for Evenity/prolia.  Patient also with reflux on the medication. Counseled on the r/b/a/I of the medications.  [facility]  Labs should be UTD. Can we do PA for Evenity Dr. Tia Flowers

## 2023-09-13 NOTE — Telephone Encounter (Signed)
 Insurance information submitted to [employer] portal. Will await summary of benefits for Evenity.

## 2023-09-16 ENCOUNTER — Encounter: Payer: Self-pay | Admitting: Gastroenterology

## 2023-09-16 ENCOUNTER — Ambulatory Visit: Admitting: Gastroenterology

## 2023-09-16 VITALS — BP 120/80 | HR 77 | Ht 60.0 in | Wt 142.4 lb

## 2023-09-16 DIAGNOSIS — G20A1 Parkinson's disease without dyskinesia, without mention of fluctuations: Secondary | ICD-10-CM | POA: Diagnosis not present

## 2023-09-16 DIAGNOSIS — Z1211 Encounter for screening for malignant neoplasm of colon: Secondary | ICD-10-CM

## 2023-09-16 NOTE — Patient Instructions (Signed)
 Please follow up as needed if symptoms increase or worsen  Due to recent changes in healthcare laws, you may see the results of your imaging and laboratory studies on MyChart before your provider has had a chance to review them.  We understand that in some cases there may be results that are confusing or concerning to you. Not all laboratory results come back in the same time frame and the provider may be waiting for multiple results in order to interpret others.  Please give us  48 hours in order for your provider to thoroughly review all the results before contacting the office for clarification of your results.   _______________________________________________________  If your blood pressure at your visit was 140/90 or greater, please contact your primary care physician to follow up on this.  _______________________________________________________  If you are age 37 or older, your body mass index should be between 23-30. Your Body mass index is 27.81 kg/m. If this is out of the aforementioned range listed, please consider follow up with your Primary Care Provider.  If you are age 51 or younger, your body mass index should be between 19-25. Your Body mass index is 27.81 kg/m. If this is out of the aformentioned range listed, please consider follow up with your Primary Care Provider.   ________________________________________________________  The [facility] providers would like to encourage you to use MYCHART to communicate with providers for non-urgent requests or questions.  Due to long hold times on the telephone, sending your provider a message by [facility] may be a faster and more efficient way to get a response.  Please allow 48 business hours for a response.  Please remember that this is for non-urgent requests.  _______________________________________________________ Thank you for trusting me with your gastrointestinal care!   Suzanna Erp, PA

## 2023-09-16 NOTE — Progress Notes (Signed)
 Chief Complaint: Consideration for colon cancer screening Primary GI MD: Para Bold  HPI: Discussed the use of AI scribe software for clinical note transcription with the patient, who gave verbal consent to proceed.  History of Present Illness Janet Solomon is a 68 year old female with Parkinson's disease who presents for a colonoscopy consultation. She was referred by her primary care physician, Dr. Luther Saltness, for a colonoscopy consultation.  Her last colonoscopy was performed almost ten years ago on [place], and she recalls that both the colonoscopy and endoscopy were normal at that time. She had the procedures done as part of a regular checkup and was experiencing basic reflux symptoms then. She does not recall any specific findings from the colonoscopy.  No current gastrointestinal symptoms such as rectal bleeding, changes in bowel habits, weight loss, nausea, or vomiting. She mentions gaining weight but is unsure if it is related to her medication.  History of a right ovarian cyst and Parkinson's disease. No known family history of colon cancer.  Past Medical History:  Diagnosis Date   GERD (gastroesophageal reflux disease)    Several years   Parkinson disease (HCC)    Sciatica     Past Surgical History:  Procedure Laterality Date   CESAREAN SECTION  1986: 1988   TUBAL LIGATION  1988    Current Outpatient Medications  Medication Sig Dispense Refill   alendronate  (FOSAMAX ) 70 MG tablet Take 1 tablet (70 mg total) by mouth every 7 (seven) days. Take with a full glass of water on an empty stomach. 12 tablet 3   carbidopa -levodopa  (SINEMET  IR) 25-100 MG tablet Take 1 tablet by mouth 3 (three) times daily. 10am/2pm/6pm 270 tablet 1   estradiol  (ESTRACE  VAGINAL) 0.1 MG/GM vaginal cream Place 1 Applicatorful vaginally at bedtime. Apply a dime size amount in the vagina nightly until surgery 42.5 g 12   Magnesium Bisglycinate (MAG GLYCINATE PO) Take by  mouth.     pregabalin  (LYRICA ) 150 MG capsule Take 150 mg by mouth 2 (two) times daily.     Vitamin D -Vitamin K (K2-D3 5000) 5000-90 UNIT-MCG CAPS Take by mouth.     No current facility-administered medications for this visit.    Allergies as of 09/16/2023   (No Known Allergies)    Family History  Problem Relation Age of Onset   Cancer Mother    Diabetes Mother    Heart disease Mother    Alcohol abuse Father    Cancer Sister    Varicose Veins Sister    Cancer Sister    Parkinson's disease Sister    Colon polyps Sister    Colon polyps Sister     Social History   Socioeconomic History   Marital status: Divorced    Spouse name: Not on file   Number of children: Not on file   Years of education: Not on file   Highest education level: 12th grade  Occupational History   Occupation: retired    Comment: Conservation officer, nature  Tobacco Use   Smoking status: Former    Current packs/day: 0.00    Average packs/day: 1.5 packs/day for 15.0 years (22.5 ttl pk-yrs)    Types: Cigarettes    Start date: 07/25/2002    Quit date: 07/24/2017    Years since quitting: 6.1   Smokeless tobacco: Never   Tobacco comments:    Quit!  Vaping Use   Vaping status: Never Used  Substance and Sexual Activity   Alcohol use: Never   Drug use: Never  Sexual activity: Not Currently    Partners: Male    Birth control/protection: Post-menopausal  Other Topics Concern   Not on file  Social History Narrative   Right handed    Retired    Social Drivers of Corporate investment banker Strain: Medium Risk (03/30/2023)   Overall Financial Resource Strain (CARDIA)    Difficulty of Paying Living Expenses: Somewhat hard  Food Insecurity: Food Insecurity Present (03/23/2023)   Hunger Vital Sign    Worried About Running Out of Food in the Last Year: Sometimes true    Ran Out of Food in the Last Year: Sometimes true  Transportation Needs: Unmet Transportation Needs (04/04/2023)   PRAPARE - Scientist, research (physical sciences) (Medical): Yes    Lack of Transportation (Non-Medical): Yes  Physical Activity: Inactive (03/23/2023)   Exercise Vital Sign    Days of Exercise per Week: 0 days    Minutes of Exercise per Session: 0 min  Stress: No Stress Concern Present (03/23/2023)   [employer] of Occupational Health - Occupational Stress Questionnaire    Feeling of Stress : Not at all  Social Connections: Moderately Isolated (03/23/2023)   Social Connection and Isolation Panel [NHANES]    Frequency of Communication with Friends and Family: More than three times a week    Frequency of Social Gatherings with Friends and Family: More than three times a week    Attends Religious Services: 1 to 4 times per year    Active Member of [employer] or Organizations: No    Attends Banker Meetings: Never    Marital Status: Divorced  Catering manager Violence: Not At Risk (03/30/2023)   Humiliation, Afraid, Rape, and Kick questionnaire    Fear of Current or Ex-Partner: No    Emotionally Abused: No    Physically Abused: No    Sexually Abused: No    Review of Systems:    Constitutional: No weight loss, fever, chills, weakness or fatigue HEENT: Eyes: No change in vision               Ears, Nose, Throat:  No change in hearing or congestion Skin: No rash or itching Cardiovascular: No chest pain, chest pressure or palpitations   Respiratory: No SOB or cough Gastrointestinal: See HPI and otherwise negative Genitourinary: No dysuria or change in urinary frequency Neurological: No headache, dizziness or syncope Musculoskeletal: No new muscle or joint pain Hematologic: No bleeding or bruising Psychiatric: No history of depression or anxiety    Physical Exam:  Vital signs: BP 120/80 (Cuff Size: Normal)   Pulse 77   Ht 5' (1.524 m)   Wt 142 lb 6.4 oz (64.6 kg)   BMI 27.81 kg/m   Constitutional: NAD, alert and cooperative.  Appears younger than stated age Head:  Normocephalic and  atraumatic. Eyes:   PEERL, EOMI. No icterus. Conjunctiva pink. Respiratory: Respirations even and unlabored. Lungs clear to auscultation bilaterally.   No wheezes, crackles, or rhonchi.  Cardiovascular:  Regular rate and rhythm. No peripheral edema, cyanosis or pallor.  Gastrointestinal:  Soft, nondistended, nontender. No rebound or guarding. Normal bowel sounds. No appreciable masses or hepatomegaly. Rectal:  Declines Msk:  Symmetrical without gross deformities. Without edema, no deformity or joint abnormality.  Neurologic:  Alert and  oriented x4;  grossly normal neurologically.  Skin:   Dry and intact without significant lesions or rashes. Psychiatric: Oriented to person, place and time. Demonstrates good judgement and reason without abnormal affect or behaviors.  RELEVANT LABS AND IMAGING: CBC    Component Value Date/Time   WBC 11.1 (H) 04/07/2022 1039   RBC 4.47 04/07/2022 1039   HGB 12.4 04/07/2022 1039   HCT 38.1 04/07/2022 1039   PLT 289.0 04/07/2022 1039   MCV 85.3 04/07/2022 1039   MCHC 32.6 04/07/2022 1039   RDW 13.6 04/07/2022 1039   LYMPHSABS 2.9 04/07/2022 1039   MONOABS 0.6 04/07/2022 1039   EOSABS 0.2 04/07/2022 1039   BASOSABS 0.0 04/07/2022 1039    CMP     Component Value Date/Time   NA 141 04/13/2023 0848   K 4.3 04/13/2023 0848   CL 106 04/13/2023 0848   CO2 29 04/13/2023 0848   GLUCOSE 105 (H) 04/13/2023 0848   BUN 10 04/13/2023 0848   CREATININE 0.92 04/13/2023 0848   CALCIUM 9.1 04/13/2023 0848   PROT 6.7 04/07/2022 1039   ALBUMIN 4.3 04/07/2022 1039   AST 20 04/07/2022 1039   ALT 12 04/07/2022 1039   ALKPHOS 76 04/07/2022 1039   BILITOT 0.8 04/07/2022 1039     Assessment/Plan:   Assessment & Plan  Colon cancer screening She is likely due for a colonoscopy as her last procedure was nearly ten years ago. No gastrointestinal symptoms or family history of colon cancer. Prefers pill preparation as she had difficulty with liquid in the past.   We do not have past record available.  Patient appears younger than stated age - Obtain previous colonoscopy records to confirm due date. - Schedule colonoscopy in [facility] with pill preparation if due.   Parkinson's disease Following with neurology.  Diagnosed November 2024 on carbidopa /levodopa   Assigned to Dr. Cherryl Corona today  Suzanna Erp, PA-C [facility] 09/16/2023, 10:11 AM  Cc: Versa Gore, NP

## 2023-09-20 NOTE — Telephone Encounter (Signed)
 PA for Evenity submitted via [employer] portal. Authorization request is pending. Request #: [id number]. Will await response.

## 2023-09-27 ENCOUNTER — Encounter: Payer: Self-pay | Admitting: Family

## 2023-09-27 ENCOUNTER — Ambulatory Visit (INDEPENDENT_AMBULATORY_CARE_PROVIDER_SITE_OTHER): Admitting: Family

## 2023-09-27 VITALS — BP 118/71 | HR 67 | Temp 97.9°F | Ht 60.0 in | Wt 143.0 lb

## 2023-09-27 DIAGNOSIS — M8580 Other specified disorders of bone density and structure, unspecified site: Secondary | ICD-10-CM | POA: Insufficient documentation

## 2023-09-27 DIAGNOSIS — Z01818 Encounter for other preprocedural examination: Secondary | ICD-10-CM

## 2023-09-27 DIAGNOSIS — M4317 Spondylolisthesis, lumbosacral region: Secondary | ICD-10-CM | POA: Insufficient documentation

## 2023-09-27 DIAGNOSIS — M5416 Radiculopathy, lumbar region: Secondary | ICD-10-CM | POA: Insufficient documentation

## 2023-09-27 HISTORY — DX: Radiculopathy, lumbar region: M54.16

## 2023-09-27 HISTORY — DX: Spondylolisthesis, lumbosacral region: M43.17

## 2023-09-27 NOTE — Progress Notes (Signed)
 Patient ID: Janet Solomon, female    DOB: [birth year], 68 y.o.   MRN: [id number]  Chief Complaint  Patient presents with   Surgical clearance    Surgery on cyst on ovary.   Discussed the use of AI scribe software for clinical note transcription with the patient, who gave verbal consent to proceed.  History of Present Illness Janet Solomon is a 68 year old female with Parkinson's disease who presents for surgical clearance for an upcoming hysterectomy. She is scheduled for a laparoscopic hysterectomy and requires surgical clearance. She has not had any previous gynecological surgeries and retains both ovaries, tubes, cervix and uterus. The procedure is expected to be outpatient. She is managed for Parkinson's disease by a neurologist with annual visits, the last being recent, and a follow-up scheduled for November. She is on medication for Parkinson's, taking Sinemet  daily which is controlling sx. There was a previous issue with scheduling a mammogram, where ultrasounds instead of Mammogram were requested but not approved, possibly due to insurance coverage.   Assessment & Plan Preop exam - Hysterectomy  - Preparing for laparoscopic hysterectomy. Pt with no previous anesthesia reactions, no cardiac history, labs to be obtained by GYN. Discussed need for cg for post-operative support and transportation needs. - Proceed with surgical clearance. - Coordinate with gynecologist for pre-operative labs. - Ensure post-operative support and transportation.  Parkinson's disease Well-managed under neurologist care. Recent checkup showed no issues. - Continue current management with neurologist. - Ensure surgical clearance for upcoming hysterectomy.   Subjective:    Outpatient Medications Prior to Visit  Medication Sig Dispense Refill   alendronate  (FOSAMAX ) 70 MG tablet Take 1 tablet (70 mg total) by mouth every 7 (seven) days. Take with a full glass of water on an empty  stomach. 12 tablet 3   carbidopa -levodopa  (SINEMET  IR) 25-100 MG tablet Take 1 tablet by mouth 3 (three) times daily. 10am/2pm/6pm 270 tablet 1   estradiol  (ESTRACE  VAGINAL) 0.1 MG/GM vaginal cream Place 1 Applicatorful vaginally at bedtime. Apply a dime size amount in the vagina nightly until surgery 42.5 g 12   Magnesium Bisglycinate (MAG GLYCINATE PO) Take by mouth.     pregabalin  (LYRICA ) 150 MG capsule Take 150 mg by mouth 2 (two) times daily.     Vitamin D -Vitamin K (K2-D3 5000) 5000-90 UNIT-MCG CAPS Take by mouth.     No facility-administered medications prior to visit.   Past Medical History:  Diagnosis Date   GERD (gastroesophageal reflux disease)    Several years   Lumbar radiculopathy 09/27/2023   Osteopenia determined by x-ray 09/27/2023   Parkinson disease (HCC)    Sciatica    Spondylolisthesis at L5-S1 level 09/27/2023   Past Surgical History:  Procedure Laterality Date   CESAREAN SECTION  1986: 1988   TUBAL LIGATION  1988   No Known Allergies    Objective:    Physical Exam Vitals and nursing note reviewed.  Constitutional:      Appearance: Normal appearance.  Cardiovascular:     Rate and Rhythm: Normal rate and regular rhythm.  Pulmonary:     Effort: Pulmonary effort is normal.     Breath sounds: Normal breath sounds.  Musculoskeletal:        General: Normal range of motion.  Skin:    General: Skin is warm and dry.  Neurological:     Mental Status: She is alert.  Psychiatric:        Mood and Affect: Mood normal.  Behavior: Behavior normal.   BP 118/71 (BP Location: Left Arm, Patient Position: Sitting, Cuff Size: Large)   Pulse 67   Temp 97.9 F (36.6 C) (Temporal)   Ht 5' (1.524 m)   Wt 143 lb (64.9 kg)   BMI 27.93 kg/m  Wt Readings from Last 3 Encounters:  09/27/23 143 lb (64.9 kg)  09/16/23 142 lb 6.4 oz (64.6 kg)  09/02/23 142 lb (64.4 kg)      Versa Gore, NP

## 2023-09-29 NOTE — Progress Notes (Signed)
 Agree with the assessment and plan as outlined by Boone Master, PA-C.

## 2023-09-30 ENCOUNTER — Encounter: Payer: Self-pay | Admitting: *Deleted

## 2023-10-04 NOTE — Telephone Encounter (Signed)
 PA for Evenity denied. Appeal letter signed by Dr. Tia Flowers and faxed to Appeal and Grievance Department. Fax #: [phone]. Will await response.

## 2023-10-10 ENCOUNTER — Telehealth: Payer: Self-pay

## 2023-10-10 NOTE — Telephone Encounter (Signed)
 Patient called & left message on triage voicemail regarding an injection that she was told about. She said someone told her it was improved but she does not know what it is.   Nelva Bang, RN has been submitting information for evenity for patient. Patient states her insurance called her & said it was approved. Patient is aware I will be routing this to Nelva Bang, RN for follow up.

## 2023-10-12 NOTE — Telephone Encounter (Signed)
 See evenity encounter.

## 2023-10-12 NOTE — Telephone Encounter (Signed)
 Returned call to patient. Message given to patient as seen below from Dr. Tia Solomon. Patient states she would like to discuss prolia with Dr. Tia Solomon at appointment on 11/04/23 and then decide what she would like to do.   Encounter closed.

## 2023-10-12 NOTE — Telephone Encounter (Signed)
 PA approved for Evenity. [id number].   Call to patient. Patient advised of PA approval and 20% coinsurance charge for Evenity each month totaling $490 monthly. Patient states she cannot afford that. Currently taking Fosamax  and due to take again tomorrow. No side effects while on Fosamax  per patient and she has been on for "about a year." RN advised would review with Dr. Tia Flowers and return call with recommendations. Patient agreeable.   Routing to provider to review and advise as patient has declined Evenity.

## 2023-10-18 DIAGNOSIS — G894 Chronic pain syndrome: Secondary | ICD-10-CM | POA: Diagnosis not present

## 2023-10-18 DIAGNOSIS — M5416 Radiculopathy, lumbar region: Secondary | ICD-10-CM | POA: Diagnosis not present

## 2023-10-18 DIAGNOSIS — G4700 Insomnia, unspecified: Secondary | ICD-10-CM | POA: Diagnosis not present

## 2023-10-18 DIAGNOSIS — M47816 Spondylosis without myelopathy or radiculopathy, lumbar region: Secondary | ICD-10-CM | POA: Diagnosis not present

## 2023-11-04 ENCOUNTER — Ambulatory Visit: Admitting: Obstetrics and Gynecology

## 2023-11-04 VITALS — BP 110/82 | HR 82 | Wt 142.0 lb

## 2023-11-04 DIAGNOSIS — N83201 Unspecified ovarian cyst, right side: Secondary | ICD-10-CM

## 2023-11-04 DIAGNOSIS — Z01818 Encounter for other preprocedural examination: Secondary | ICD-10-CM | POA: Diagnosis not present

## 2023-11-04 DIAGNOSIS — R52 Pain, unspecified: Secondary | ICD-10-CM

## 2023-11-04 MED ORDER — IBUPROFEN 800 MG PO TABS: 800.0000 mg | ORAL_TABLET | Freq: Three times a day (TID) | ORAL | 1 refills | Status: DC | PRN

## 2023-11-04 MED ORDER — METOCLOPRAMIDE HCL 10 MG PO TABS: 10.0000 mg | ORAL_TABLET | Freq: Three times a day (TID) | ORAL | 0 refills | Status: DC | PRN

## 2023-11-04 MED ORDER — OXYCODONE HCL 5 MG PO TABS: 5.0000 mg | ORAL_TABLET | Freq: Four times a day (QID) | ORAL | 0 refills | Status: DC | PRN

## 2023-11-04 NOTE — H&P (View-Only) (Signed)
 PREOP H&P for Janet Solomon, BSO, cystoscopy for enlarging PM ovarian cyst  [id number] (2 c/s). Divorced Other or two or more races Declined female here for evaluation of an adnexal mass.  Has some mild RLQ pain does not require medication and can bend and move New diagnosis of parkinson's and has been busy with testing. Ova1 normal  She had a MRI in 2/23 for evaluation of back pain and was incidentally noted to have: A 5.4 cm T2 hyperintense cystic lesion within the right adnexa, partially visualized, and not fully characterized on this exam. Follow-up examination with dedicated pelvic ultrasound recommended for further evaluation.  Patient with osteoporosis -2.5 repeat bone scan at -2.4 on fosamax  x1 year. High risk for fracture with parkinsons and having an unstable gait and not much improvement on fosamax . To try for Evenity/prolia. Cannot afford 20% with evenity. Will see what prolia will cost.  Patient also with mild reflux on the medication. Counseled on the r/b/a/I of the medications. PA placed  Narrative & Impression  CLINICAL DATA:  Cystic lesion RIGHT adnexa, follow-up abnormal MRI, postmenopausal   EXAM: TRANSABDOMINAL AND TRANSVAGINAL ULTRASOUND OF PELVIS   TECHNIQUE: Both transabdominal and transvaginal ultrasound examinations of the pelvis were performed. Transabdominal technique was performed for global imaging of the pelvis including uterus, ovaries, adnexal regions, and pelvic cul-de-sac. It was necessary to proceed with endovaginal exam following the transabdominal exam to visualize the uterus, endometrium, ovaries.   COMPARISON:  MR lumbar spine 07/11/2021   FINDINGS: Uterus   Measurements: 5.0 x 2.7 x 4.3 cm = volume: 30 mL. Anteverted, atrophic. No focal mass.   Endometrium   Thickness: 4 mm.  No endometrial fluid or mass   Right ovary   No normal appearing RIGHT ovary visualized, see below   Left ovary   Not visualized, likely obscured by bowel   Other  findings   No free pelvic fluid. Cystic structure identified within the RIGHT adnexa containing partial septations and minimal scattered internal echogenicity, overall measuring 5.0 x 3.7 x 5.5 cm. Superior wall is slightly irregular. This represents an indeterminate cystic lesion. No additional masses.   IMPRESSION: Unremarkable uterus and endometrial complex with nonvisualization of ovaries.   5.0 x 3.7 x 5.5 cm diameter complex cystic mass in the RIGHT adnexa, could be of ovarian or paraovarian origin and is indeterminate; based on lesion size and age, surgical evaluation is recommended to exclude cystic ovarian neoplasm.   These results will be called to the ordering clinician or representative by the Radiologist Assistant, and communication documented in the PACS or [employer].     Electronically Signed   By: Wynelle Heather M.D.   On: 07/24/2021 14:39     No pain, no vaginal bleeding.   She is having severe sciatica pain on the right. It hurts to sit, she feels it has contributed to her constipation. She typically has a BM 2-3 x a week, now down to 1-2 x a week. She doesn't feel like she empties.  She just had her second epidural last week.   Some urge incontinence, just started 3 weeks.  She is voiding frequently, voiding normal amounts, no pain.     Not sexually active.   No LMP recorded. Patient is postmenopausal.          Sexually active: No.  The current method of family planning is post menopausal status.    Exercising: No.  She is having lower back pain.  Smoker:  no  Health Maintenance: Pap:  long time ago per patient  History of abnormal Pap:  yes had cervical surgery in her 20's  MMG:  years ago has one ordered. Will only do if she can do MRI as it is painful. MRI order placed BMD:   none order placed Colonoscopy: years ago in [place] , 8-10 years ago.   TDaP:  unsure  Gardasil: n/a  Denies any VB   Repeat PUS today with 5.85cm uterus Left  ovary 1.60cm Right ovary 2.38cm with a 6.9cm x3.0 x 6.2cm avascular simple septated cyst is noted in the RT adnexa.  Hard to distinguish if it is in the ovary vs. Paraovarian.   Has grown in size since last ultrasound on 07/24/21  OB History     Gravida  6   Para  6   Term  6   Preterm      AB      Living  6      SAB      IAB      Ectopic      Multiple      Live Births  6           Past Medical History:  Diagnosis Date   GERD (gastroesophageal reflux disease)    Several years   Lumbar radiculopathy 09/27/2023   Osteopenia determined by x-ray 09/27/2023   Parkinson disease (HCC)    Sciatica    Spondylolisthesis at L5-S1 level 09/27/2023   Past Surgical History:  Procedure Laterality Date   CESAREAN SECTION  1986: 1988   TUBAL LIGATION  1988   Social History   Socioeconomic History   Marital status: Divorced    Spouse name: Not on file   Number of children: Not on file   Years of education: Not on file   Highest education level: 12th grade  Occupational History   Occupation: retired    Comment: Conservation officer, nature  Tobacco Use   Smoking status: Former    Current packs/day: 0.00    Average packs/day: 1.5 packs/day for 15.0 years (22.5 ttl pk-yrs)    Types: Cigarettes    Start date: 07/25/2002    Quit date: 07/24/2017    Years since quitting: 6.2   Smokeless tobacco: Never   Tobacco comments:    Quit!  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  (09/20/2023)   Overall Financial Resource Strain (CARDIA)    Difficulty of Paying Living Expenses: Not very hard  Food Insecurity: Food Insecurity Present (09/20/2023)   Hunger Vital Sign     Worried About Running Out of Food in the Last Year: Sometimes true    Ran Out of Food in the Last Year: Never true  Transportation Needs: Unmet Transportation Needs (09/20/2023)   PRAPARE - Transportation    Lack of Transportation (Medical): Yes    Lack of Transportation (Non-Medical): Yes  Physical Activity: Inactive (09/20/2023)   Exercise Vital Sign    Days of Exercise per Week: 0 days    Minutes of Exercise per Session: 0 min  Stress: No Stress Concern Present (09/20/2023)   [employer] of Occupational Health -  Occupational Stress Questionnaire    Feeling of Stress : Not at all  Social Connections: Socially Isolated (09/20/2023)   Social Connection and Isolation Panel    Frequency of Communication with Friends and Family: Twice a week    Frequency of Social Gatherings with Friends and Family: More than three times a week    Attends Religious Services: Never    Database administrator or Organizations: No    Attends Engineer, structural: Never    Marital Status: Divorced   Current Outpatient Medications on File Prior to Visit  Medication Sig Dispense Refill   alendronate  (FOSAMAX ) 70 MG tablet Take 1 tablet (70 mg total) by mouth every 7 (seven) days. Take with a full glass of water on an empty stomach. 12 tablet 3   carbidopa -levodopa  (SINEMET  IR) 25-100 MG tablet Take 1 tablet by mouth 3 (three) times daily. 10am/2pm/6pm 270 tablet 1   DULoxetine HCl 30 MG CSDR      estradiol  (ESTRACE  VAGINAL) 0.1 MG/GM vaginal cream Place 1 Applicatorful vaginally at bedtime. Apply a dime size amount in the vagina nightly until surgery 42.5 g 12   Magnesium Bisglycinate (MAG GLYCINATE PO) Take by mouth.     pregabalin  (LYRICA ) 150 MG capsule Take 150 mg by mouth 2 (two) times daily.     Vitamin D -Vitamin K (K2-D3 5000) 5000-90 UNIT-MCG CAPS Take by mouth.     No current facility-administered medications on file prior to visit.     No Known Allergies Blood pressure 110/82, pulse  82, weight 142 lb (64.4 kg), SpO2 97%. Sve: normal cervix, atrophic vagina PUS used for remaining exam No Known Allergies  Abdomen: NT, ND, no guarding no rebound A/p PM enlarging 6cm complex ovarian mass, normal ova1 no pelvic pain Preop for Janet Solomon/BSO, cystoscopy   The risks of surgery were discussed in detail with the patient including but not limited to: bleeding which may require transfusion or reoperation; infection which may require prolonged hospitalization or re-hospitalization and antibiotic therapy; injury to bowel, bladder, ureters and major vessels or other surrounding organs which may lead to other procedures; formation of adhesions; need for additional procedures including laparotomy or subsequent procedures secondary to intraoperative injury or abnormal pathology; thromboembolic phenomenon; incisional problems and other postoperative or anesthesia complications.  The postoperative expectations were also discussed in detail. The patient also understands the alternative treatment options which were discussed in full. All questions were answered.  Patient would like to proceed with the procedure.  30 minutes spent on reviewing records, imaging,  and one on one patient time and counseling patient and documentation Dr. Tia Flowers  Dr. Tia Flowers

## 2023-11-04 NOTE — Patient Instructions (Signed)
 Robotic Laparoscopic Hysterectomy, Care After  The following information offers guidance on how to care for yourself after your procedure. Your health care provider may also give you more specific instructions. If you have problems or questions, contact your health care provider. What can I expect after the procedure? After the procedure, it is common to have: Pain, bruising, and numbness around your incisions. Tiredness (fatigue). Poor appetite. Less interest in sex. Vaginal discharge or bleeding. You will need to use a sanitary pad after this procedure.  HEAVY BLEEDING LIKE A PERIOD IS NOT NORMAL.  PLEASE CALL YOUR PROVIDER IF SOAKING A PAD. Feelings of sadness or other emotions. If your ovaries were also removed, it is also common to have symptoms of menopause, such as hot flashes, night sweats, and lack of sleep (insomnia).  Ovaries should stay in if at all possible until at least the age of 30. Follow these instructions at home: Medicines Take over-the-counter and prescription medicines only as told by your health care provider. Ask your health care provider if the medicine prescribed to you: Requires you to avoid driving or using machinery. Can cause constipation. You may need to take these actions to prevent or treat constipation: Drink enough fluid to keep your urine pale yellow. Take over-the-counter or prescription medicines. Eat foods that are high in fiber, such as beans, whole grains, and fresh fruits and vegetables. Limit foods that are high in fat and processed sugars, such as fried or sweet foods.  Also, avoid spicy foods.  NAUSEA IS COMMON THE FIRST NIGHT OF SURGERY.  IF IT LASTS BEYOND 24 HOURS, CALL YOUR PROVIDER.  NAUSEA MEDICATION WAS GIVEN AT YOUR PREOP APPOINTMENT THAT YOU CAN TAKE AFTER SURGERY. Incision care  Follow instructions from your health care provider about how to take care of your incisions. Make sure you: LEAVE INCISION OPEN AND DRY-NO BANDAGES Leave  stitches (sutures), skin glue, or adhesive strips in place UNTIL 2 WEEKS THEN REMOVE IN THE SHOWER.  If adhesive strip edges start to loosen and curl up, you may trim the loose edges. Check your incision areas every day for signs of infection. Check for: More redness, swelling, or pain. Fluid or blood. Warmth. Pus or a bad smell. Activity  Rest as told by your health care provider. Avoid sitting for a long time without moving. Get up to take short walks every 1-2 hours. This is important to improve blood flow and breathing. Ask for help if you feel weak or unsteady. Return to your normal activities as told by your health care provider. Ask your health care provider what activities are safe for you. Do not lift anything that is heavier than 10 lb (4.5 kg), or the limit that you are told, for 8 WEEKS after surgery or until your health care provider says that it is safe. If you were given a sedative during the procedure, it can affect you for several hours. Do not drive or operate machinery until your health care provider says that it is safe. Lifestyle Do not use any products that contain nicotine or tobacco. These products include cigarettes, chewing tobacco, and vaping devices, such as e-cigarettes. These can delay healing after surgery. If you need help quitting, ask your health care provider. Do not drink alcohol until your health care provider approves. General instructions FOR 2 WEEKS AFTER SURGERY, THEN YOU MAY USE TUBS AND HOT TUBS Do not douche, use tampons, or have sex for at least 10 weeks, or as told by your health care  provider. If you struggle with physical or emotional changes after your procedure, speak with your health care provider or a therapist. Do not take baths, swim, or use a hot tub until your health care provider approves. You may only be allowed to take showers for 2 weeks. IF YOU HAVE BURNING WITH URINATION, PLEASE CALL YOUR DOCTOR. BLADDER INFECTIONS MAY OCCUR AFTER  SURGERY Try to have someone at home with you for the first 1-2 weeks to help with your daily chores. Wear compression stockings as told by your health care provider. These stockings help to prevent blood clots and reduce swelling in your legs. Keep all follow-up visits. This is important. Contact a health care provider if: You have any of these signs of infection: Chills or a fever 168f OR GREATER. More redness, swelling, or pain around an incision. Fluid or blood coming from an incision. Warmth coming from an incision. Pus or a bad smell coming from an incision. Burning with urination. Urinary frequency or cramping.   IF YOU HAVE THESE SYMPTOMS, PLEASE CALL THE OFFICE TO COME EVALUATE FOR A BLADDER INFECTION AT [phone] An incision opens. You feel dizzy or light-headed. You have pain or bleeding when you urinate, or you are unable to urinate. You have abnormal vaginal discharge. You have pain that does not get better with medicine. Get help right away if: You have a fever and your symptoms suddenly get worse. You have severe abdominal pain. You have chest pain or shortness of breath. You may have chest pain and shortness of breath from the CO2 gas for a few days after surgery.  This is very common.  Walking, Gas-X and motrin  will usually help relieve this discomfort You faint. You have pain, swelling, or redness in your leg. You have heavy vaginal bleeding with blood clots, soaking through a sanitary pad in less than 1 hour. These symptoms may represent a serious problem that is an emergency. Do not wait to see if the symptoms will go away. Get medical help right away. Call your local emergency services (911 in the [place] Do not drive yourself to the hospital. Summary  CONSTIPATION MEDICATION AFTER SURGERY: COLACE, MOM, MIRALAX, GAS X are all helpful to have on hand, if needed.  FILL ALL POSTOP MEDICATION BEFORE SURGERY  After the procedure, it is common to have pain and bruising  around your incisions. Do not take baths, swim, or use a hot tub until your health care provider approves. Do not lift anything that is heavier than 13 lb (4.5 kg), or the limit that you are told, for one month after surgery or until your health care provider says that it is safe. Tell your health care provider if you have any signs or symptoms of infection after the procedure. Get help right away if you have severe abdominal pain, chest pain, shortness of breath, or heavy bleeding from your vagina. This information is not intended to replace advice given to you by your  health care provider. Make sure you discuss any questions you have with your health care provider. Document Revised: 01/10/2020 Document Reviewed: 01/11/2020 Elsevier Patient Education  2024 [employer].

## 2023-11-04 NOTE — Progress Notes (Signed)
 PREOP H&P for RLH, BSO, cystoscopy for enlarging PM ovarian cyst  [id number] (2 c/s). Divorced Other or two or more races Declined female here for evaluation of an adnexal mass.  Has some mild RLQ pain does not require medication and can bend and move New diagnosis of parkinson's and has been busy with testing. Ova1 normal  She had a MRI in 2/23 for evaluation of back pain and was incidentally noted to have: A 5.4 cm T2 hyperintense cystic lesion within the right adnexa, partially visualized, and not fully characterized on this exam. Follow-up examination with dedicated pelvic ultrasound recommended for further evaluation.  Patient with osteoporosis -2.5 repeat bone scan at -2.4 on fosamax  x1 year. High risk for fracture with parkinsons and having an unstable gait and not much improvement on fosamax . To try for Evenity/prolia. Cannot afford 20% with evenity. Will see what prolia will cost.  Patient also with mild reflux on the medication. Counseled on the r/b/a/I of the medications. PA placed  Narrative & Impression  CLINICAL DATA:  Cystic lesion RIGHT adnexa, follow-up abnormal MRI, postmenopausal   EXAM: TRANSABDOMINAL AND TRANSVAGINAL ULTRASOUND OF PELVIS   TECHNIQUE: Both transabdominal and transvaginal ultrasound examinations of the pelvis were performed. Transabdominal technique was performed for global imaging of the pelvis including uterus, ovaries, adnexal regions, and pelvic cul-de-sac. It was necessary to proceed with endovaginal exam following the transabdominal exam to visualize the uterus, endometrium, ovaries.   COMPARISON:  MR lumbar spine 07/11/2021   FINDINGS: Uterus   Measurements: 5.0 x 2.7 x 4.3 cm = volume: 30 mL. Anteverted, atrophic. No focal mass.   Endometrium   Thickness: 4 mm.  No endometrial fluid or mass   Right ovary   No normal appearing RIGHT ovary visualized, see below   Left ovary   Not visualized, likely obscured by bowel   Other  findings   No free pelvic fluid. Cystic structure identified within the RIGHT adnexa containing partial septations and minimal scattered internal echogenicity, overall measuring 5.0 x 3.7 x 5.5 cm. Superior wall is slightly irregular. This represents an indeterminate cystic lesion. No additional masses.   IMPRESSION: Unremarkable uterus and endometrial complex with nonvisualization of ovaries.   5.0 x 3.7 x 5.5 cm diameter complex cystic mass in the RIGHT adnexa, could be of ovarian or paraovarian origin and is indeterminate; based on lesion size and age, surgical evaluation is recommended to exclude cystic ovarian neoplasm.   These results will be called to the ordering clinician or representative by the Radiologist Assistant, and communication documented in the PACS or [employer].     Electronically Signed   By: Wynelle Heather M.D.   On: 07/24/2021 14:39     No pain, no vaginal bleeding.   She is having severe sciatica pain on the right. It hurts to sit, she feels it has contributed to her constipation. She typically has a BM 2-3 x a week, now down to 1-2 x a week. She doesn't feel like she empties.  She just had her second epidural last week.   Some urge incontinence, just started 3 weeks.  She is voiding frequently, voiding normal amounts, no pain.     Not sexually active.   No LMP recorded. Patient is postmenopausal.          Sexually active: No.  The current method of family planning is post menopausal status.    Exercising: No.  She is having lower back pain.  Smoker:  no  Health Maintenance: Pap:  long time ago per patient  History of abnormal Pap:  yes had cervical surgery in her 20's  MMG:  years ago has one ordered. Will only do if she can do MRI as it is painful. MRI order placed BMD:   none order placed Colonoscopy: years ago in [place] , 8-10 years ago.   TDaP:  unsure  Gardasil: n/a  Denies any VB   Repeat PUS today with 5.85cm uterus Left  ovary 1.60cm Right ovary 2.38cm with a 6.9cm x3.0 x 6.2cm avascular simple septated cyst is noted in the RT adnexa.  Hard to distinguish if it is in the ovary vs. Paraovarian.   Has grown in size since last ultrasound on 07/24/21  OB History     Gravida  6   Para  6   Term  6   Preterm      AB      Living  6      SAB      IAB      Ectopic      Multiple      Live Births  6           Past Medical History:  Diagnosis Date   GERD (gastroesophageal reflux disease)    Several years   Lumbar radiculopathy 09/27/2023   Osteopenia determined by x-ray 09/27/2023   Parkinson disease (HCC)    Sciatica    Spondylolisthesis at L5-S1 level 09/27/2023   Past Surgical History:  Procedure Laterality Date   CESAREAN SECTION  1986: 1988   TUBAL LIGATION  1988   Social History   Socioeconomic History   Marital status: Divorced    Spouse name: Not on file   Number of children: Not on file   Years of education: Not on file   Highest education level: 12th grade  Occupational History   Occupation: retired    Comment: Conservation officer, nature  Tobacco Use   Smoking status: Former    Current packs/day: 0.00    Average packs/day: 1.5 packs/day for 15.0 years (22.5 ttl pk-yrs)    Types: Cigarettes    Start date: 07/25/2002    Quit date: 07/24/2017    Years since quitting: 6.2   Smokeless tobacco: Never   Tobacco comments:    Quit!  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  (09/20/2023)   Overall Financial Resource Strain (CARDIA)    Difficulty of Paying Living Expenses: Not very hard  Food Insecurity: Food Insecurity Present (09/20/2023)   Hunger Vital Sign     Worried About Running Out of Food in the Last Year: Sometimes true    Ran Out of Food in the Last Year: Never true  Transportation Needs: Unmet Transportation Needs (09/20/2023)   PRAPARE - Transportation    Lack of Transportation (Medical): Yes    Lack of Transportation (Non-Medical): Yes  Physical Activity: Inactive (09/20/2023)   Exercise Vital Sign    Days of Exercise per Week: 0 days    Minutes of Exercise per Session: 0 min  Stress: No Stress Concern Present (09/20/2023)   [employer] of Occupational Health -  Occupational Stress Questionnaire    Feeling of Stress : Not at all  Social Connections: Socially Isolated (09/20/2023)   Social Connection and Isolation Panel    Frequency of Communication with Friends and Family: Twice a week    Frequency of Social Gatherings with Friends and Family: More than three times a week    Attends Religious Services: Never    Database administrator or Organizations: No    Attends Engineer, structural: Never    Marital Status: Divorced   Current Outpatient Medications on File Prior to Visit  Medication Sig Dispense Refill   alendronate  (FOSAMAX ) 70 MG tablet Take 1 tablet (70 mg total) by mouth every 7 (seven) days. Take with a full glass of water on an empty stomach. 12 tablet 3   carbidopa -levodopa  (SINEMET  IR) 25-100 MG tablet Take 1 tablet by mouth 3 (three) times daily. 10am/2pm/6pm 270 tablet 1   DULoxetine HCl 30 MG CSDR      estradiol  (ESTRACE  VAGINAL) 0.1 MG/GM vaginal cream Place 1 Applicatorful vaginally at bedtime. Apply a dime size amount in the vagina nightly until surgery 42.5 g 12   Magnesium Bisglycinate (MAG GLYCINATE PO) Take by mouth.     pregabalin  (LYRICA ) 150 MG capsule Take 150 mg by mouth 2 (two) times daily.     Vitamin D -Vitamin K (K2-D3 5000) 5000-90 UNIT-MCG CAPS Take by mouth.     No current facility-administered medications on file prior to visit.     No Known Allergies Blood pressure 110/82, pulse  82, weight 142 lb (64.4 kg), SpO2 97%. Sve: normal cervix, atrophic vagina PUS used for remaining exam No Known Allergies  Abdomen: NT, ND, no guarding no rebound A/p PM enlarging 6cm complex ovarian mass, normal ova1 no pelvic pain Preop for RLH/BSO, cystoscopy   The risks of surgery were discussed in detail with the patient including but not limited to: bleeding which may require transfusion or reoperation; infection which may require prolonged hospitalization or re-hospitalization and antibiotic therapy; injury to bowel, bladder, ureters and major vessels or other surrounding organs which may lead to other procedures; formation of adhesions; need for additional procedures including laparotomy or subsequent procedures secondary to intraoperative injury or abnormal pathology; thromboembolic phenomenon; incisional problems and other postoperative or anesthesia complications.  The postoperative expectations were also discussed in detail. The patient also understands the alternative treatment options which were discussed in full. All questions were answered.  Patient would like to proceed with the procedure.  30 minutes spent on reviewing records, imaging,  and one on one patient time and counseling patient and documentation Dr. Tia Flowers  Dr. Tia Flowers

## 2023-11-15 ENCOUNTER — Encounter: Payer: Self-pay | Admitting: *Deleted

## 2023-11-15 DIAGNOSIS — G4700 Insomnia, unspecified: Secondary | ICD-10-CM | POA: Diagnosis not present

## 2023-11-15 DIAGNOSIS — M47816 Spondylosis without myelopathy or radiculopathy, lumbar region: Secondary | ICD-10-CM | POA: Diagnosis not present

## 2023-11-15 DIAGNOSIS — M5416 Radiculopathy, lumbar region: Secondary | ICD-10-CM | POA: Diagnosis not present

## 2023-11-15 DIAGNOSIS — G894 Chronic pain syndrome: Secondary | ICD-10-CM | POA: Diagnosis not present

## 2023-11-17 ENCOUNTER — Telehealth: Payer: Self-pay | Admitting: *Deleted

## 2023-11-17 NOTE — Telephone Encounter (Signed)
-----   Message from Almarie MARLA Carpen sent at 11/04/2023 11:30 AM EDT ----- Can you let her know the cost per injection with prolia  Evenity was 20% per shot with [employer] She is on fosamax  now for a year.  Can do this for 3-4 more years but may change to prolia anytime. Told her probably about 200$ a shot, but would ask you  Dr. Carpen

## 2023-11-17 NOTE — Telephone Encounter (Signed)
 Call to patient. Patient advised with [facility] Medicare, patient responsibility for Prolia is 20% and estimated OOP is ~$350 which would be paid twice a year. Patient states she would like to think about it and when I see Dr. Glennon next, I will give her my decision. Patient advised to continue Fosamax  and patient agreeable.   Routing to provider as RICK.   Encounter closed.

## 2023-11-24 ENCOUNTER — Encounter (HOSPITAL_COMMUNITY): Payer: Self-pay | Admitting: Obstetrics and Gynecology

## 2023-11-24 NOTE — Pre-Procedure Instructions (Signed)
 Surgical Instructions   Your procedure is scheduled on : Friday,  12-02-2023. Report to [facility] Main Entrance A at 7:30  A.M., then check in with the Admitting office. Any questions or running late day of surgery: call [phone]  Questions prior to your surgery date: call [phone], Monday-Friday, 8am-4pm. If you experience any cold or flu symptoms such as cough, fever, chills, shortness of breath, etc. between now and your scheduled surgery, please notify your surgeon office.    Remember:  Do not eat any food and do not drink any liquids after midnight the night before your surgery.  This includes no water,  candy,  gum,  and  mints.   Take these medicines the morning of surgery with A SIPS OF WATER : Pregabalin  (lyrica ) Carbidopa -levodopa  (sinemet  IR)   May take these medicines IF NEEDED: none    One week prior to surgery, STOP taking any Aspirin (unless otherwise instructed by your surgeon) Aleve, Naproxen, Ibuprofen , Motrin , Advil , Goody's, BC's, all herbal medications, fish oil, and non-prescription vitamins.                     Do NOT Smoke (Tobacco/Vaping) and Do Not drink alcohol for 24 hours prior to your procedure.  If you use a CPAP at night, you may bring your mask/headgear for your overnight stay.   You will be asked to remove any contacts, glasses, piercing's, hearing aid's, dentures/partials prior to surgery. Please bring cases for these items if needed.    Patients discharged the day of surgery will not be allowed to drive home, and someone needs to stay with them for 24 hours.  SURGICAL WAITING ROOM VISITATION Patients may have no more than 2 support people in the waiting area - these visitors may rotate.   Pre-op nurse will coordinate an appropriate time for 1 ADULT support person, who may not rotate, to accompany patient in pre-op.  Children under the age of 29 must have an adult with them who is not the patient and must remain in the main waiting  area with an adult.  If the patient needs to stay at the hospital during part of their recovery, the visitor guidelines for inpatient rooms apply.  Please refer to the [facility] website for the visitor guidelines for any additional information.   If you received a COVID test during your pre-op visit  it is requested that you wear a mask when out in public, stay away from anyone that may not be feeling well and notify your surgeon if you develop symptoms. If you have been in contact with anyone that has tested positive in the last 10 days please notify you surgeon.      Pre-operative CHG Bathing Instructions   You can play a key role in reducing the risk of infection after surgery. Your skin needs to be as free of germs as possible. You can reduce the number of germs on your skin by washing with CHG (chlorhexidine gluconate) soap before surgery. CHG is an antiseptic soap that kills germs and continues to kill germs even after washing.   DO NOT use if you have an allergy to chlorhexidine/CHG or antibacterial soaps. If your skin becomes reddened or irritated, stop using the CHG and notify Pre-op nurse day of surgery.             TAKE A SHOWER THE NIGHT BEFORE SURGERY AND THE DAY OF SURGERY    Please keep in mind the following:  DO  NOT shave, including legs and underarms, 48 hours prior to surgery.   You may shave your face before/day of surgery.  Place clean sheets on your bed the night before surgery Use a clean washcloth (not used since being washed) for each shower. DO NOT sleep with pet's night before surgery.  CHG Shower Instructions:  Wash your face and private area with normal soap. If you choose to wash your hair, wash first with your normal shampoo.  After you use shampoo/soap, rinse your hair and body thoroughly to remove shampoo/soap residue.  Turn the water OFF and apply half the bottle of CHG soap to a CLEAN washcloth.  Apply CHG soap ONLY FROM YOUR NECK DOWN TO YOUR TOES  (washing for 3-5 minutes)  DO NOT use CHG soap on face, private areas, open wounds, or sores.  Pay special attention to the area where your surgery is being performed.  If you are having back surgery, having someone wash your back for you may be helpful. Wait 2 minutes after CHG soap is applied, then you may rinse off the CHG soap.  Pat dry with a clean towel  Put on clean pajamas    Additional instructions for the day of surgery: DO NOT APPLY any lotions,  powder,  oils,  deodorants (may use underarm deodorant) , cologne/  perfumes  or makeup Do not wear jewelry / piercing's/ metal/  permanent jewelry must be removed prior to arrival day of surgery.  (No plastic piercing) Do not wear nail polish, gel polish, artificial nails, or any other type of covering on natural finger nails (toe nails are okay) Do not bring valuables to the hospital. [facility] is not responsible for valuables/personal belongings. Put on clean/comfortable clothes.  Please brush your teeth.  Ask your nurse before applying any prescription medications to the skin.

## 2023-11-24 NOTE — Progress Notes (Signed)
 Spoke w/ via phone for pre-op interview--- pt Lab needs dos---- no        Lab results------ lab appt 11-30-2023 @ 1100 getting CBC/ T&S COVID test -----patient states asymptomatic no test needed Arrive at ------- 0730 on 12-02-2023 NPO after MN w/ exception sips of water w/ meds Pre-Surgery Ensure or G2: n/a  Med rec completed Medications to take morning of surgery ----- lyrica , sinemet  Diabetic medication ----- n/a  GLP1 agonist last dose: n/a GLP1 instructions:  Patient instructed no nail polish to be worn day of surgery Patient instructed to bring photo id and insurance card day of surgery Patient aware to have Driver (ride ) / caregiver    for 24 hours after surgery - daughter, genesis rivera Patient Special Instructions ----- will pick up bag w/ soap and written instructions at lab appt Pre-Op special Instructions ----- n/a  Patient verbalized understanding of instructions that were given at this phone interview. Patient denies chest pain, sob, fever, cough at the interview.

## 2023-11-30 ENCOUNTER — Encounter (HOSPITAL_COMMUNITY)
Admission: RE | Admit: 2023-11-30 | Discharge: 2023-11-30 | Disposition: A | Discharge status: Home / Self Care | Source: Ambulatory Visit | Attending: Obstetrics and Gynecology | Admitting: Obstetrics and Gynecology

## 2023-11-30 DIAGNOSIS — Z01818 Encounter for other preprocedural examination: Secondary | ICD-10-CM | POA: Diagnosis not present

## 2023-11-30 LAB — CBC
HCT: 37.4 % (ref 36.0–46.0)
Hemoglobin: 12.1 g/dL (ref 12.0–15.0)
MCH: 27.9 pg (ref 26.0–34.0)
MCHC: 32.4 g/dL (ref 30.0–36.0)
MCV: 86.2 fL (ref 80.0–100.0)
Platelets: 267 K/uL (ref 150–400)
RBC: 4.34 MIL/uL (ref 3.87–5.11)
RDW: 13.2 % (ref 11.5–15.5)
WBC: 8.3 K/uL (ref 4.0–10.5)
nRBC: 0 % (ref 0.0–0.2)

## 2023-11-30 LAB — TYPE AND SCREEN
ABO/RH(D): O POS
Antibody Screen: NEGATIVE

## 2023-12-01 NOTE — Anesthesia Preprocedure Evaluation (Addendum)
 Anesthesia Evaluation  Patient identified by MRN, date of birth, ID band Patient awake    Reviewed: Allergy & Precautions, NPO status , Patient's Chart, lab work & pertinent test results  History of Anesthesia Complications Negative for: history of anesthetic complications  Airway Mallampati: II  TM Distance: >3 FB Neck ROM: Full    Dental  (+) Missing,    Pulmonary former smoker   Pulmonary exam normal        Cardiovascular negative cardio ROS Normal cardiovascular exam     Neuro/Psych Parkinson's dx    GI/Hepatic Neg liver ROS,GERD  Controlled,,  Endo/Other  negative endocrine ROS    Renal/GU negative Renal ROS     Musculoskeletal negative musculoskeletal ROS (+)    Abdominal   Peds  Hematology negative hematology ROS (+)   Anesthesia Other Findings Day of surgery medications reviewed with patient.  Reproductive/Obstetrics cyst of right ovary                              Anesthesia Physical Anesthesia Plan  ASA: 2  Anesthesia Plan: General   Post-op Pain Management: Tylenol  PO (pre-op)* and Toradol  IV (intra-op)*   Induction: Intravenous  PONV Risk Score and Plan: 3 and Treatment may vary due to age or medical condition, Ondansetron , Dexamethasone , Midazolam , Scopolamine patch - Pre-op, Propofol  infusion and TIVA  Airway Management Planned: Oral ETT  Additional Equipment: None  Intra-op Plan:   Post-operative Plan: Extubation in OR  Informed Consent: I have reviewed the patients History and Physical, chart, labs and discussed the procedure including the risks, benefits and alternatives for the proposed anesthesia with the patient or authorized representative who has indicated his/her understanding and acceptance.     Dental advisory given  Plan Discussed with: CRNA  Anesthesia Plan Comments:          Anesthesia Quick Evaluation

## 2023-12-02 ENCOUNTER — Other Ambulatory Visit: Payer: Self-pay

## 2023-12-02 ENCOUNTER — Ambulatory Visit (HOSPITAL_COMMUNITY): Payer: Self-pay | Admitting: Anesthesiology

## 2023-12-02 ENCOUNTER — Ambulatory Visit (HOSPITAL_BASED_OUTPATIENT_CLINIC_OR_DEPARTMENT_OTHER): Payer: Self-pay | Admitting: Anesthesiology

## 2023-12-02 ENCOUNTER — Encounter (HOSPITAL_COMMUNITY)
Admission: RE | Disposition: A | Discharge status: Home / Self Care | Payer: Self-pay | Source: Home / Self Care | Attending: Obstetrics and Gynecology

## 2023-12-02 ENCOUNTER — Ambulatory Visit (HOSPITAL_COMMUNITY)
Admission: RE | Admit: 2023-12-02 | Discharge: 2023-12-03 | Disposition: A | Discharge status: Home / Self Care | Attending: Obstetrics and Gynecology | Admitting: Obstetrics and Gynecology

## 2023-12-02 ENCOUNTER — Encounter (HOSPITAL_COMMUNITY): Payer: Self-pay | Admitting: Obstetrics and Gynecology

## 2023-12-02 ENCOUNTER — Telehealth: Payer: Self-pay | Admitting: Obstetrics and Gynecology

## 2023-12-02 DIAGNOSIS — Z90710 Acquired absence of both cervix and uterus: Secondary | ICD-10-CM | POA: Diagnosis present

## 2023-12-02 DIAGNOSIS — Z780 Asymptomatic menopausal state: Secondary | ICD-10-CM | POA: Insufficient documentation

## 2023-12-02 DIAGNOSIS — Z87891 Personal history of nicotine dependence: Secondary | ICD-10-CM | POA: Insufficient documentation

## 2023-12-02 DIAGNOSIS — N838 Other noninflammatory disorders of ovary, fallopian tube and broad ligament: Secondary | ICD-10-CM

## 2023-12-02 DIAGNOSIS — N8003 Adenomyosis of the uterus: Secondary | ICD-10-CM | POA: Diagnosis not present

## 2023-12-02 DIAGNOSIS — N83201 Unspecified ovarian cyst, right side: Secondary | ICD-10-CM | POA: Insufficient documentation

## 2023-12-02 DIAGNOSIS — N858 Other specified noninflammatory disorders of uterus: Secondary | ICD-10-CM | POA: Diagnosis not present

## 2023-12-02 DIAGNOSIS — M5441 Lumbago with sciatica, right side: Secondary | ICD-10-CM | POA: Insufficient documentation

## 2023-12-02 DIAGNOSIS — N888 Other specified noninflammatory disorders of cervix uteri: Secondary | ICD-10-CM | POA: Insufficient documentation

## 2023-12-02 DIAGNOSIS — Z79899 Other long term (current) drug therapy: Secondary | ICD-10-CM | POA: Insufficient documentation

## 2023-12-02 DIAGNOSIS — Z01818 Encounter for other preprocedural examination: Secondary | ICD-10-CM

## 2023-12-02 DIAGNOSIS — N898 Other specified noninflammatory disorders of vagina: Secondary | ICD-10-CM | POA: Diagnosis not present

## 2023-12-02 DIAGNOSIS — M8580 Other specified disorders of bone density and structure, unspecified site: Secondary | ICD-10-CM | POA: Insufficient documentation

## 2023-12-02 DIAGNOSIS — N8501 Benign endometrial hyperplasia: Secondary | ICD-10-CM | POA: Insufficient documentation

## 2023-12-02 DIAGNOSIS — R52 Pain, unspecified: Secondary | ICD-10-CM | POA: Diagnosis not present

## 2023-12-02 DIAGNOSIS — G20A1 Parkinson's disease without dyskinesia, without mention of fluctuations: Secondary | ICD-10-CM | POA: Insufficient documentation

## 2023-12-02 DIAGNOSIS — C4491 Basal cell carcinoma of skin, unspecified: Secondary | ICD-10-CM

## 2023-12-02 DIAGNOSIS — N3941 Urge incontinence: Secondary | ICD-10-CM | POA: Diagnosis not present

## 2023-12-02 DIAGNOSIS — D282 Benign neoplasm of uterine tubes and ligaments: Secondary | ICD-10-CM | POA: Diagnosis not present

## 2023-12-02 DIAGNOSIS — K5900 Constipation, unspecified: Secondary | ICD-10-CM | POA: Diagnosis not present

## 2023-12-02 DIAGNOSIS — L728 Other follicular cysts of the skin and subcutaneous tissue: Secondary | ICD-10-CM | POA: Insufficient documentation

## 2023-12-02 DIAGNOSIS — R102 Pelvic and perineal pain: Secondary | ICD-10-CM | POA: Diagnosis not present

## 2023-12-02 DIAGNOSIS — K219 Gastro-esophageal reflux disease without esophagitis: Secondary | ICD-10-CM | POA: Diagnosis not present

## 2023-12-02 DIAGNOSIS — Z09 Encounter for follow-up examination after completed treatment for conditions other than malignant neoplasm: Secondary | ICD-10-CM

## 2023-12-02 HISTORY — DX: Pelvic and perineal pain unspecified side: R10.20

## 2023-12-02 HISTORY — DX: Unspecified ovarian cyst, right side: N83.201

## 2023-12-02 HISTORY — PX: CYSTOSCOPY: SHX5120

## 2023-12-02 HISTORY — DX: Personal history of other (healed) physical injury and trauma: Z87.828

## 2023-12-02 HISTORY — DX: Dysmenorrhea, unspecified: N94.6

## 2023-12-02 HISTORY — DX: Family history of other specified conditions: Z84.89

## 2023-12-02 HISTORY — DX: Unsteadiness on feet: R26.81

## 2023-12-02 HISTORY — PX: HYSTERECTOMY, TOTAL, LAPAROSCOPIC, ROBOT-ASSISTED WITH SALPINGECTOMY: SHX7587

## 2023-12-02 HISTORY — DX: Anemia, unspecified: D64.9

## 2023-12-02 HISTORY — DX: Insomnia, unspecified: G47.00

## 2023-12-02 HISTORY — DX: Excessive and frequent menstruation with regular cycle: N92.0

## 2023-12-02 HISTORY — DX: Pelvic and perineal pain: R10.2

## 2023-12-02 HISTORY — PX: EXCISION VAGINAL CYST: SHX5825

## 2023-12-02 HISTORY — DX: Other chronic pain: G89.29

## 2023-12-02 HISTORY — DX: Age-related osteoporosis without current pathological fracture: M81.0

## 2023-12-02 HISTORY — DX: Weakness: R53.1

## 2023-12-02 LAB — ABO/RH: ABO/RH(D): O POS

## 2023-12-02 SURGERY — HYSTERECTOMY, TOTAL, LAPAROSCOPIC, ROBOT-ASSISTED WITH SALPINGECTOMY
Anesthesia: General | Site: Vagina

## 2023-12-02 MED ORDER — 0.9 % SODIUM CHLORIDE (POUR BTL) OPTIME
TOPICAL | Status: DC | PRN
  Administered 2023-12-02: 1000 mL

## 2023-12-02 MED ORDER — ACETAMINOPHEN 500 MG PO TABS
1000.0000 mg | ORAL_TABLET | Freq: Once | ORAL | Status: AC
  Administered 2023-12-02: 1000 mg via ORAL

## 2023-12-02 MED ORDER — ONDANSETRON HCL 4 MG/2ML IJ SOLN
INTRAMUSCULAR | Status: AC
  Filled 2023-12-02: qty 2

## 2023-12-02 MED ORDER — SODIUM CHLORIDE 0.9 % IV SOLN
INTRAVENOUS | Status: AC
  Filled 2023-12-02: qty 2

## 2023-12-02 MED ORDER — ROCURONIUM BROMIDE 10 MG/ML (PF) SYRINGE
PREFILLED_SYRINGE | INTRAVENOUS | Status: AC
  Filled 2023-12-02: qty 10

## 2023-12-02 MED ORDER — ONDANSETRON HCL 4 MG PO TABS: 4.0000 mg | ORAL_TABLET | Freq: Four times a day (QID) | ORAL | Status: DC | PRN

## 2023-12-02 MED ORDER — ORAL CARE MOUTH RINSE: 15.0000 mL | Freq: Once | OROMUCOSAL | Status: AC

## 2023-12-02 MED ORDER — ACETAMINOPHEN 500 MG PO TABS: 1000.0000 mg | ORAL_TABLET | ORAL | Status: DC

## 2023-12-02 MED ORDER — SUGAMMADEX SODIUM 200 MG/2ML IV SOLN
INTRAVENOUS | Status: AC
  Filled 2023-12-02: qty 2

## 2023-12-02 MED ORDER — LACTATED RINGERS IV SOLN: INTRAVENOUS | Status: DC

## 2023-12-02 MED ORDER — OXYCODONE HCL 5 MG PO TABS
5.0000 mg | ORAL_TABLET | ORAL | Status: DC | PRN
  Administered 2023-12-02: 5 mg via ORAL
  Filled 2023-12-02: qty 1

## 2023-12-02 MED ORDER — LIDOCAINE 2% (20 MG/ML) 5 ML SYRINGE
INTRAMUSCULAR | Status: DC | PRN
  Administered 2023-12-02: 60 mg via INTRAVENOUS

## 2023-12-02 MED ORDER — SODIUM CHLORIDE (PF) 0.9 % IJ SOLN
INTRAMUSCULAR | Status: AC
  Filled 2023-12-02: qty 10

## 2023-12-02 MED ORDER — PROPOFOL 10 MG/ML IV BOLUS
INTRAVENOUS | Status: AC
  Filled 2023-12-02: qty 20

## 2023-12-02 MED ORDER — PHENYLEPHRINE 80 MCG/ML (10ML) SYRINGE FOR IV PUSH (FOR BLOOD PRESSURE SUPPORT)
PREFILLED_SYRINGE | INTRAVENOUS | Status: AC
  Filled 2023-12-02: qty 10

## 2023-12-02 MED ORDER — MIDAZOLAM HCL 2 MG/2ML IJ SOLN
INTRAMUSCULAR | Status: AC
  Filled 2023-12-02: qty 2

## 2023-12-02 MED ORDER — IBUPROFEN 600 MG PO TABS
600.0000 mg | ORAL_TABLET | Freq: Four times a day (QID) | ORAL | Status: DC
  Administered 2023-12-02 (×2): 600 mg via ORAL
  Filled 2023-12-02 (×3): qty 1

## 2023-12-02 MED ORDER — ROCURONIUM BROMIDE 10 MG/ML (PF) SYRINGE
PREFILLED_SYRINGE | INTRAVENOUS | Status: DC | PRN
  Administered 2023-12-02: 50 mg via INTRAVENOUS
  Administered 2023-12-02: 20 mg via INTRAVENOUS

## 2023-12-02 MED ORDER — SODIUM CHLORIDE 0.9 % IV SOLN
2.0000 g | INTRAVENOUS | Status: AC
  Administered 2023-12-02: 2 g via INTRAVENOUS
  Filled 2023-12-02: qty 2

## 2023-12-02 MED ORDER — HYDROMORPHONE HCL 1 MG/ML IJ SOLN: 0.2000 mg | INTRAMUSCULAR | Status: DC | PRN

## 2023-12-02 MED ORDER — SODIUM CHLORIDE 0.9 % IV SOLN: INTRAVENOUS | Status: DC | PRN

## 2023-12-02 MED ORDER — PREGABALIN 75 MG PO CAPS
150.0000 mg | ORAL_CAPSULE | Freq: Two times a day (BID) | ORAL | Status: DC
  Administered 2023-12-02 – 2023-12-03 (×2): 150 mg via ORAL
  Filled 2023-12-02 (×2): qty 2

## 2023-12-02 MED ORDER — SODIUM CHLORIDE 0.9 % IV SOLN
Freq: Once | INTRAVENOUS | Status: DC
  Filled 2023-12-02: qty 10

## 2023-12-02 MED ORDER — ESTRADIOL 0.1 MG/GM VA CREA
TOPICAL_CREAM | VAGINAL | Status: AC
  Filled 2023-12-02: qty 42.5

## 2023-12-02 MED ORDER — ACETAMINOPHEN 500 MG PO TABS
ORAL_TABLET | ORAL | Status: AC
  Filled 2023-12-02: qty 2

## 2023-12-02 MED ORDER — PHENYLEPHRINE 80 MCG/ML (10ML) SYRINGE FOR IV PUSH (FOR BLOOD PRESSURE SUPPORT)
PREFILLED_SYRINGE | INTRAVENOUS | Status: DC | PRN
  Administered 2023-12-02 (×2): 40 ug via INTRAVENOUS

## 2023-12-02 MED ORDER — POVIDONE-IODINE 10 % EX SWAB
2.0000 | Freq: Once | CUTANEOUS | Status: AC
  Administered 2023-12-02: 2 via TOPICAL

## 2023-12-02 MED ORDER — ACETAMINOPHEN 325 MG PO TABS: 650.0000 mg | ORAL_TABLET | ORAL | Status: DC | PRN

## 2023-12-02 MED ORDER — CARBIDOPA-LEVODOPA 25-100 MG PO TABS
1.0000 | ORAL_TABLET | Freq: Three times a day (TID) | ORAL | Status: DC
  Administered 2023-12-02 – 2023-12-03 (×2): 1 via ORAL
  Filled 2023-12-02 (×3): qty 1

## 2023-12-02 MED ORDER — LIDOCAINE 2% (20 MG/ML) 5 ML SYRINGE
INTRAMUSCULAR | Status: AC
  Filled 2023-12-02: qty 5

## 2023-12-02 MED ORDER — ONDANSETRON HCL 4 MG/2ML IJ SOLN: 4.0000 mg | Freq: Four times a day (QID) | INTRAMUSCULAR | Status: DC | PRN

## 2023-12-02 MED ORDER — SODIUM CHLORIDE 0.9 % IV SOLN: 12.5000 mg | INTRAVENOUS | Status: DC | PRN

## 2023-12-02 MED ORDER — ONDANSETRON HCL 4 MG/2ML IJ SOLN
INTRAMUSCULAR | Status: DC | PRN
  Administered 2023-12-02: 4 mg via INTRAVENOUS

## 2023-12-02 MED ORDER — DULOXETINE HCL 20 MG PO CPEP
20.0000 mg | ORAL_CAPSULE | Freq: Every day | ORAL | Status: DC
  Administered 2023-12-02: 20 mg via ORAL
  Filled 2023-12-02: qty 1

## 2023-12-02 MED ORDER — METRONIDAZOLE 500 MG/100ML IV SOLN
500.0000 mg | Freq: Once | INTRAVENOUS | Status: AC
  Administered 2023-12-02: 500 mg via INTRAVENOUS
  Filled 2023-12-02: qty 100

## 2023-12-02 MED ORDER — BUPIVACAINE HCL (PF) 0.5 % IJ SOLN
INTRAMUSCULAR | Status: DC | PRN
  Administered 2023-12-02: 23 mL

## 2023-12-02 MED ORDER — CARBIDOPA-LEVODOPA 25-100 MG PO TABS
1.0000 | ORAL_TABLET | Freq: Once | ORAL | Status: AC
  Administered 2023-12-02: 1 via ORAL
  Filled 2023-12-02: qty 1

## 2023-12-02 MED ORDER — MIDAZOLAM HCL 2 MG/2ML IJ SOLN
INTRAMUSCULAR | Status: DC | PRN
  Administered 2023-12-02: 2 mg via INTRAVENOUS

## 2023-12-02 MED ORDER — DEXAMETHASONE SODIUM PHOSPHATE 10 MG/ML IJ SOLN
INTRAMUSCULAR | Status: AC
  Filled 2023-12-02: qty 1

## 2023-12-02 MED ORDER — CHLORHEXIDINE GLUCONATE 0.12 % MT SOLN
15.0000 mL | Freq: Once | OROMUCOSAL | Status: AC
  Administered 2023-12-02: 15 mL via OROMUCOSAL

## 2023-12-02 MED ORDER — PROPOFOL 500 MG/50ML IV EMUL
INTRAVENOUS | Status: DC | PRN
  Administered 2023-12-02: 150 ug/kg/min via INTRAVENOUS

## 2023-12-02 MED ORDER — CHLORHEXIDINE GLUCONATE 0.12 % MT SOLN
OROMUCOSAL | Status: AC
  Filled 2023-12-02: qty 15

## 2023-12-02 MED ORDER — PROPOFOL 10 MG/ML IV BOLUS
INTRAVENOUS | Status: DC | PRN
  Administered 2023-12-02: 100 mg via INTRAVENOUS

## 2023-12-02 MED ORDER — KETOROLAC TROMETHAMINE 30 MG/ML IJ SOLN
INTRAMUSCULAR | Status: AC
Start: 2023-12-02 — End: 2023-12-02
  Filled 2023-12-02: qty 1

## 2023-12-02 MED ORDER — ESTRADIOL 0.1 MG/GM VA CREA
TOPICAL_CREAM | VAGINAL | Status: DC | PRN
  Administered 2023-12-02: 1 via VAGINAL

## 2023-12-02 MED ORDER — METRONIDAZOLE 500 MG/100ML IV SOLN
INTRAVENOUS | Status: AC
  Filled 2023-12-02: qty 100

## 2023-12-02 MED ORDER — FENTANYL CITRATE (PF) 100 MCG/2ML IJ SOLN
INTRAMUSCULAR | Status: DC | PRN
  Administered 2023-12-02 (×2): 50 ug via INTRAVENOUS

## 2023-12-02 MED ORDER — SUGAMMADEX SODIUM 200 MG/2ML IV SOLN
INTRAVENOUS | Status: DC | PRN
  Administered 2023-12-02: 200 mg via INTRAVENOUS

## 2023-12-02 MED ORDER — HYDROMORPHONE HCL 1 MG/ML IJ SOLN: 0.2500 mg | INTRAMUSCULAR | Status: DC | PRN

## 2023-12-02 MED ORDER — BUPIVACAINE HCL (PF) 0.5 % IJ SOLN
INTRAMUSCULAR | Status: AC
  Filled 2023-12-02: qty 30

## 2023-12-02 MED ORDER — KETOROLAC TROMETHAMINE 30 MG/ML IJ SOLN
INTRAMUSCULAR | Status: DC | PRN
Start: 2023-12-02 — End: 2023-12-02
  Administered 2023-12-02: 15 mg via INTRAVENOUS

## 2023-12-02 MED ORDER — DEXAMETHASONE SODIUM PHOSPHATE 10 MG/ML IJ SOLN
INTRAMUSCULAR | Status: DC | PRN
  Administered 2023-12-02: 10 mg via INTRAVENOUS

## 2023-12-02 MED ORDER — PANTOPRAZOLE SODIUM 40 MG IV SOLR
40.0000 mg | Freq: Every day | INTRAVENOUS | Status: DC
  Administered 2023-12-02: 40 mg via INTRAVENOUS
  Filled 2023-12-02: qty 10

## 2023-12-02 MED ORDER — FENTANYL CITRATE (PF) 100 MCG/2ML IJ SOLN
INTRAMUSCULAR | Status: AC
  Filled 2023-12-02: qty 4

## 2023-12-02 SURGICAL SUPPLY — 55 items
APPLICATOR ARISTA FLEXITIP XL (MISCELLANEOUS) IMPLANT
BARRIER ADHS 3X4 INTERCEED (GAUZE/BANDAGES/DRESSINGS) IMPLANT
BLADE SURG 11 STRL SS (BLADE) IMPLANT
COVER BACK TABLE 60X90IN (DRAPES) ×3 IMPLANT
COVER MAYO STAND STRL (DRAPES) ×3 IMPLANT
COVER TIP SHEARS 8 DVNC (MISCELLANEOUS) ×3 IMPLANT
DEFOGGER SCOPE WARM SEASHARP (MISCELLANEOUS) ×3 IMPLANT
DERMABOND ADVANCED .7 DNX12 (GAUZE/BANDAGES/DRESSINGS) ×3 IMPLANT
DRAPE ARM DVNC X/XI (DISPOSABLE) ×12 IMPLANT
DRAPE COLUMN DVNC XI (DISPOSABLE) ×3 IMPLANT
DRAPE SURG IRRIG POUCH 19X23 (DRAPES) ×3 IMPLANT
DRAPE UTILITY XL STRL (DRAPES) ×3 IMPLANT
DRIVER NDL MEGA SUTCUT DVNCXI (INSTRUMENTS) ×3 IMPLANT
DRIVER NDLE MEGA SUTCUT DVNCXI (INSTRUMENTS) ×3 IMPLANT
DURAPREP 26ML APPLICATOR (WOUND CARE) ×3 IMPLANT
ELECTRODE REM PT RTRN 9FT ADLT (ELECTROSURGICAL) ×3 IMPLANT
FORCEPS PROGRASP DVNC XI (FORCEP) ×3 IMPLANT
GAUZE 4X4 16PLY ~~LOC~~+RFID DBL (SPONGE) IMPLANT
GLOVE BIOGEL PI IND STRL 7.0 (GLOVE) ×6 IMPLANT
GLOVE NEODERM STER SZ 7 (GLOVE) ×9 IMPLANT
GOWN STRL REUS W/TWL LRG LVL3 (GOWN DISPOSABLE) ×3 IMPLANT
HEMOSTAT ARISTA ABSORB 3G PWDR (HEMOSTASIS) IMPLANT
HOLDER FOLEY CATH W/STRAP (MISCELLANEOUS) IMPLANT
IRRIGATION SUCT STRKRFLW 2 WTP (MISCELLANEOUS) ×3 IMPLANT
KIT PINK PAD W/HEAD ARM REST (MISCELLANEOUS) ×3 IMPLANT
KIT TURNOVER KIT B (KITS) ×3 IMPLANT
LEGGING LITHOTOMY PAIR STRL (DRAPES) ×3 IMPLANT
MANIFOLD NEPTUNE II (INSTRUMENTS) ×3 IMPLANT
NS IRRIG 1000ML POUR BTL (IV SOLUTION) ×3 IMPLANT
OBTURATOR OPTICALSTD 8 DVNC (TROCAR) ×3 IMPLANT
OCCLUDER COLPOPNEUMO (BALLOONS) IMPLANT
PACK CYSTO (CUSTOM PROCEDURE TRAY) ×3 IMPLANT
PACK ROBOT WH (CUSTOM PROCEDURE TRAY) ×3 IMPLANT
PACK ROBOTIC GOWN (GOWN DISPOSABLE) ×3 IMPLANT
PAD OB MATERNITY 11 LF (PERSONAL CARE ITEMS) ×3 IMPLANT
RUMI II 3.0CM BLUE KOH-EFFICIE (DISPOSABLE) IMPLANT
RUMI II GYRUS 2.5CM BLUE (DISPOSABLE) IMPLANT
RUMI II GYRUS 3.5CM BLUE (DISPOSABLE) IMPLANT
RUMI II GYRUS 4.0CM BLUE (DISPOSABLE) IMPLANT
SCISSORS MNPLR CVD DVNC XI (INSTRUMENTS) ×3 IMPLANT
SEAL UNIV 5-12 XI (MISCELLANEOUS) ×9 IMPLANT
SEALER VESSEL EXT DVNC XI (MISCELLANEOUS) IMPLANT
SET CYSTO W/LG BORE CLAMP LF (SET/KITS/TRAYS/PACK) ×3 IMPLANT
SET TUBE SMOKE EVAC HIGH FLOW (TUBING) ×3 IMPLANT
SLEEVE SCD COMPRESS KNEE MED (STOCKING) ×3 IMPLANT
SPIKE FLUID TRANSFER (MISCELLANEOUS) ×3 IMPLANT
SUT MNCRL AB 4-0 PS2 18 (SUTURE) ×3 IMPLANT
SUT VIC AB 2-0 SH 27XBRD (SUTURE) IMPLANT
SUT VLOC 180 0 9IN GS21 (SUTURE) ×6 IMPLANT
SYSTEM BAG RETRIEVAL 10MM (BASKET) IMPLANT
TIP UTERINE 6.7X10CM GRN DISP (MISCELLANEOUS) IMPLANT
TIP UTERINE 6.7X8CM BLUE DISP (MISCELLANEOUS) IMPLANT
TOWEL GREEN STERILE (TOWEL DISPOSABLE) ×3 IMPLANT
TRAY FOLEY W/BAG SLVR 14FR (SET/KITS/TRAYS/PACK) ×3 IMPLANT
UNDERPAD 30X36 HEAVY ABSORB (UNDERPADS AND DIAPERS) ×3 IMPLANT

## 2023-12-02 NOTE — Transfer of Care (Signed)
 Immediate Anesthesia Transfer of Care Note  Patient: Janet Solomon  Procedure(s) Performed: TOTAL LAPAROSCOPIC ROBOT-ASSISTED HYSTERECTOMY WITH BILATERAL SALPINGO-OOPHORECTOMY (Bilateral: Pelvis) CYSTOSCOPY (Bladder) EXCISION VAGINAL WALL CYST (Vagina )  Patient Location: PACU  Anesthesia Type:General  Level of Consciousness: alert  and patient cooperative  Airway & Oxygen Therapy: Patient connected to nasal cannula oxygen  Post-op Assessment: Report given to RN, Post -op Vital signs reviewed and stable, Patient moving all extremities X 4, and Patient able to stick tongue midline  Post vital signs: Reviewed and stable  Last Vitals:  Vitals Value Taken Time  BP 132/72 12/02/23 11:33  Temp 97.3   Pulse 65 12/02/23 11:36  Resp 14 12/02/23 11:36  SpO2 98 % 12/02/23 11:36  Vitals shown include unfiled device data.  Last Pain:  Vitals:   12/02/23 0745  TempSrc: Oral  PainSc: 0-No pain      Patients Stated Pain Goal: 3 (12/02/23 0745)  Complications: No notable events documented.

## 2023-12-02 NOTE — Interval H&P Note (Signed)
 History and Physical Interval Note:  12/02/2023 7:36 AM  Janet Solomon  has presented today for surgery, with the diagnosis of cyst of right ovary, pain.  The various methods of treatment have been discussed with the patient and family. After consideration of risks, benefits and other options for treatment, the patient has consented to  Procedure(s): HYSTERECTOMY, TOTAL, LAPAROSCOPIC, ROBOT-ASSISTED WITH SALPINGECTOMY (Bilateral) CYSTOSCOPY (N/A) as a surgical intervention.  The patient's history has been reviewed, patient examined, no change in status, stable for surgery.  I have reviewed the patient's chart and labs.  Questions were answered to the patient's satisfaction.     Almarie MARLA Carpen

## 2023-12-02 NOTE — Anesthesia Postprocedure Evaluation (Signed)
 Anesthesia Post Note  Patient: Janet Solomon  Procedure(s) Performed: TOTAL LAPAROSCOPIC ROBOT-ASSISTED HYSTERECTOMY WITH BILATERAL SALPINGO-OOPHORECTOMY (Bilateral: Pelvis) CYSTOSCOPY (Bladder) EXCISION VAGINAL WALL CYST (Vagina )     Patient location during evaluation: PACU Anesthesia Type: General Level of consciousness: awake and alert Pain management: pain level controlled Vital Signs Assessment: post-procedure vital signs reviewed and stable Respiratory status: spontaneous breathing, nonlabored ventilation and respiratory function stable Cardiovascular status: blood pressure returned to baseline Postop Assessment: no apparent nausea or vomiting Anesthetic complications: no   No notable events documented.  Last Vitals:  Vitals:   12/02/23 1230 12/02/23 1310  BP: (!) 157/79 (!) 160/79  Pulse: (!) 52 (!) 55  Resp: 14 16  Temp:    SpO2: 94% 93%    Last Pain:  Vitals:   12/02/23 1230  TempSrc:   PainSc: 0-No pain                 Vertell Row

## 2023-12-02 NOTE — Telephone Encounter (Signed)
 Noted left enlarging cyst on shoulder. Needs removal. Discussed with daughter and told her I would put referral in.  Was not aware she had this and would have needed consent to remove at the same time as her RLH. Referral placed. Dr> Glennon

## 2023-12-02 NOTE — Anesthesia Procedure Notes (Signed)
 Procedure Name: Intubation Date/Time: 12/02/2023 9:54 AM  Performed by: Viviana Almarie DASEN, CRNAPre-anesthesia Checklist: Patient identified, Emergency Drugs available, Suction available and Patient being monitored Patient Re-evaluated:Patient Re-evaluated prior to induction Oxygen Delivery Method: Circle system utilized Preoxygenation: Pre-oxygenation with 100% oxygen Induction Type: IV induction Ventilation: Mask ventilation without difficulty Grade View: Grade I Tube type: Oral Tube size: 6.5 mm Number of attempts: 1 Airway Equipment and Method: Stylet and Bite block Placement Confirmation: ETT inserted through vocal cords under direct vision, positive ETCO2 and breath sounds checked- equal and bilateral Secured at: 20 cm Tube secured with: Tape Dental Injury: Teeth and Oropharynx as per pre-operative assessment

## 2023-12-02 NOTE — Op Note (Signed)
 12/02/2023   [id number]  Janet Solomon         OPERATIVE REPORT   Preop Diagnosis: pelvic pain, Enlarging adnexal cyst in menopause on the right Procedure: robotic hysterectomy, BSO, cystoscopy   Surgeon: Dr. Almarie Rollo Carpen Assistant: Judyann Rattler, RN   Fluids: please see anesthesia report   Complications: None Anesthesia: General     Findings:  boggy 8cm uterus, normal ovaries . Right tube with 8cm paratubal cyst.  This was removed intact with bag at the end of the case and sent to pathology. Cystoscopy at the end of the case with normal bladder and patent ureters bilaterally.   Estimated blood loss: Minimal <25cc   Specimens: Uterus, cervix and bilateral tubes and ovaries   Disposition of specimen: Pathology      Of note: patient with large subdermal cyst on her left shoulder. No consent obtained for removal and to refer to general surgery for management as an outpatient.     Patient is taken to the operating room. She is placed in the supine position. She is a running IV in place. Informed consent was present on the chart. SCDs on her lower extremities and functioning properly. Patient was positioned while she was awake.  Her legs were placed in the low lithotomy position in Lenox stirrups. Her arms were tucked by the side.  General endotracheal anesthesia was administered by the anesthesia staff without difficulty.      Dura prep was then used to prep the abdomen and Hibiclens  was used to prep the inner thighs, perineum and vagina. Once 3 minutes had past the patient was draped in a normal standard fashion. A proper time out was performed and everyone agreed.  The legs were lifted to the high lithotomy position. A bivalve speculum was inserted into the vagina and the anterior lip of the cervix was grasped with single-tooth tenaculum.  The uterus sounded to  8 cm. Pratt dilators were used to dilate the cervix.  The RUMI uterine manipulator was  obtained inserted into the endometrial cavity and the bulb of the disposable tip was inflated with 8 cc of normal saline. There was a good fit of the KOH ring around the cervix. The tenaculum and bivavle speculum was removed. There is also good manipulation of the uterus.  A Foley catheter was placed to straight drain.  Clear urine was noted. Legs were lowered to the low lithotomy position and attention was turned the abdomen.   Superior to the umbilicus, marcaine  0.25% used to anesthetize the skin.  Using #11 blade, 8mm skin incision was made.  The 8mm robotic trocar and sleeve was inserted under direct visualization.  CO2 gas was  started and patient was placed in trendelenburg position.  Two additional 8mm ports were placed under direct visualization in the left and right lower quadrant.     Ureters were identifies.  Attention was turned to the left side.  The left IP ligament was noted away from the ureters and this was desiccated and transected with the vessel sealer.  The uterine ovarian pedicle was serially clamped cauterized and incised. Left round ligament was serially clamped cauterized and incised. The anterior and posterior peritoneum of the inferior leaf of the broad ligament were opened. The beginning of the bladder flap was created.  The bladder was taken down below the level of the KOH ring. The left uterine artery skeletonized and then just superior to the KOH ring this vessel was serially clamped, cauterized, and incised.  Attention was turned the right side. A large paratubal cyst was removed. This was placed in a bag intact at the end of the case and delivered through the vagina.  The uterus was placed on stretch to the opposite side.    The right IP ligament was away from the ureter and this was desiccated and transected with the vessel sealer.  Then the right uterine ovarian pedicle was serially clamped cauterized and incised. Next the right round ligament was serially clamped cauterized  and incised. The anterior posterior peritoneum of the inferiorly for the broad ligament were opened. The anterior peritoneum was carried across to the dissection on the left side. The remainder of the bladder flap was created using sharp dissection. The bladder was well below the level of the KOH ring. The right uterine artery skeletonized. Then the right uterine artery, above the level of the KOH ring, was serially clamped cauterized and incised. The uterus was devascularized at this point.   The colpotomy was performed.  This was carried around a circumferential fashion until the vaginal mucosa was completely incised in the specimen was freed.  The specimen was then delivered to the vagina.  A vaginal occlusive device was used to maintain the pneumoperitoneum   Instruments were changed with a needle driver and prograsp.  Using a 9 inch  zero V-lock suture, the cuff was closed by incorporating the anterior and posterior vaginal mucosa in each stitch. This was carried across all the way to the left corner and a running fashion. Two stitches were brought back towards the midline and the suture was cut flush with the vagina. The needles were brought out the pelvis. The pelvis was irrigated with NS with ancef . All pedicles were inspected. No bleeding was noted.   CO2 pressures were lowered to 8mm Hg.  Again, no bleeding was noted.  Ureters were noted deep in the pelvis to be peristalsing.  At this point the procedure was completed.  The remaining instruments were removed.  The ports were removed under direct visualization of the laparoscope and the pneumoperitoneum was relieved.   The skin was then closed with subcuticular stitches of 3-0 Vicryl. The skin was cleansed Dermabond was applied. Attention was then turned the vagina and the cuff was inspected. No bleeding was noted.  The Foley catheter was removed.  Cystoscopy was performed.  No sutures or bladder injuries were noted.  Ureters were noted with normal  urine jets from each one was seen.  Foley was left out after the cystoscopic fluid was drained and cystoscope removed.  Sponge, lap, needle, instrument counts were correct x2. Patient tolerated the procedure very well. She was awakened from anesthesia, extubated and taken to recovery in stable condition.      Dr. Glennon

## 2023-12-03 ENCOUNTER — Encounter (HOSPITAL_COMMUNITY): Payer: Self-pay | Admitting: Obstetrics and Gynecology

## 2023-12-03 DIAGNOSIS — M8580 Other specified disorders of bone density and structure, unspecified site: Secondary | ICD-10-CM | POA: Diagnosis not present

## 2023-12-03 DIAGNOSIS — L728 Other follicular cysts of the skin and subcutaneous tissue: Secondary | ICD-10-CM | POA: Diagnosis not present

## 2023-12-03 DIAGNOSIS — M5441 Lumbago with sciatica, right side: Secondary | ICD-10-CM | POA: Diagnosis not present

## 2023-12-03 DIAGNOSIS — Z87891 Personal history of nicotine dependence: Secondary | ICD-10-CM | POA: Diagnosis not present

## 2023-12-03 DIAGNOSIS — Z780 Asymptomatic menopausal state: Secondary | ICD-10-CM | POA: Diagnosis not present

## 2023-12-03 DIAGNOSIS — N898 Other specified noninflammatory disorders of vagina: Secondary | ICD-10-CM | POA: Diagnosis not present

## 2023-12-03 DIAGNOSIS — Z79899 Other long term (current) drug therapy: Secondary | ICD-10-CM | POA: Diagnosis not present

## 2023-12-03 DIAGNOSIS — K5900 Constipation, unspecified: Secondary | ICD-10-CM | POA: Diagnosis not present

## 2023-12-03 DIAGNOSIS — G20A1 Parkinson's disease without dyskinesia, without mention of fluctuations: Secondary | ICD-10-CM | POA: Diagnosis not present

## 2023-12-03 DIAGNOSIS — K219 Gastro-esophageal reflux disease without esophagitis: Secondary | ICD-10-CM | POA: Diagnosis not present

## 2023-12-03 NOTE — Progress Notes (Signed)
 OBGYN PO NOTE  Patient is doing well.  Voiding and ambulating.  No VB Tolerating diet..  Blood pressure 120/65, pulse 81, temperature 98.2 F (36.8 C), temperature source Oral, resp. rate 16, height 5' (1.524 m), weight 64.9 kg, SpO2 94%.   CBC    Component Value Date/Time   WBC 8.3 11/30/2023 1135   RBC 4.34 11/30/2023 1135   HGB 12.1 11/30/2023 1135   HCT 37.4 11/30/2023 1135   PLT 267 11/30/2023 1135   MCV 86.2 11/30/2023 1135   MCH 27.9 11/30/2023 1135   MCHC 32.4 11/30/2023 1135   RDW 13.2 11/30/2023 1135   LYMPHSABS 2.9 04/07/2022 1039   MONOABS 0.6 04/07/2022 1039   EOSABS 0.2 04/07/2022 1039   BASOSABS 0.0 04/07/2022 1039     AAOX3, NAD No resp distress NT, ND incisions I/c/d  A/p POD#1 RLHBSO, cystoscopy doing well Discharge to home today  Dr. Glennon

## 2023-12-05 ENCOUNTER — Ambulatory Visit: Payer: Self-pay | Admitting: Obstetrics and Gynecology

## 2023-12-05 LAB — SURGICAL PATHOLOGY

## 2023-12-16 ENCOUNTER — Ambulatory Visit: Admitting: Obstetrics and Gynecology

## 2023-12-16 ENCOUNTER — Encounter: Payer: Self-pay | Admitting: Obstetrics and Gynecology

## 2023-12-16 VITALS — BP 120/72 | HR 90

## 2023-12-16 DIAGNOSIS — E2839 Other primary ovarian failure: Secondary | ICD-10-CM

## 2023-12-16 DIAGNOSIS — Z09 Encounter for follow-up examination after completed treatment for conditions other than malignant neoplasm: Secondary | ICD-10-CM

## 2023-12-16 DIAGNOSIS — M8580 Other specified disorders of bone density and structure, unspecified site: Secondary | ICD-10-CM

## 2023-12-16 MED ORDER — DENOSUMAB 60 MG/ML ~~LOC~~ SOSY: 60.0000 mg | PREFILLED_SYRINGE | Freq: Once | SUBCUTANEOUS | Status: DC

## 2023-12-16 NOTE — Progress Notes (Signed)
 Patient presents for 2 week postop from [facility], bilateral salpingectomy, cystoscopy. She is doing well. No fevers, VB, dysuria or severe abdominal pain. On fosamax . Can forget at times. She wants to do prolia and will save the money with each injection. -2.4 on last dxa  BP 120/72 (BP Location: Left Arm, Patient Position: Sitting)   Pulse 90   SpO2 98%   Abdomen: incisions I/c/d, NT, ND  A/p PO from [facility] 2 weeks doing well Encouraged no heavy lifting, pushing, pulling greater than 10 lbs for full 8 weeks 2. Pelvic rest for the entire 10 wks 3. RTC with any concerns or with heavy bleeding, fevers or severe abdominal pain. 4. Patient to schedule prolia. To get one year bone scan in March from starting fosamax  to ensure change.  To stay on fosamax  until starting prolia.  Dr. Glennon

## 2023-12-19 ENCOUNTER — Emergency Department (HOSPITAL_BASED_OUTPATIENT_CLINIC_OR_DEPARTMENT_OTHER)
Admission: EM | Admit: 2023-12-19 | Discharge: 2023-12-19 | Disposition: A | Discharge status: Home / Self Care | Attending: Emergency Medicine | Admitting: Emergency Medicine

## 2023-12-19 ENCOUNTER — Other Ambulatory Visit: Payer: Self-pay

## 2023-12-19 ENCOUNTER — Ambulatory Visit: Payer: Self-pay

## 2023-12-19 ENCOUNTER — Other Ambulatory Visit (HOSPITAL_BASED_OUTPATIENT_CLINIC_OR_DEPARTMENT_OTHER): Payer: Self-pay

## 2023-12-19 DIAGNOSIS — R21 Rash and other nonspecific skin eruption: Secondary | ICD-10-CM | POA: Insufficient documentation

## 2023-12-19 DIAGNOSIS — G20C Parkinsonism, unspecified: Secondary | ICD-10-CM | POA: Insufficient documentation

## 2023-12-19 MED ORDER — PREDNISONE 10 MG PO TABS
ORAL_TABLET | ORAL | 0 refills | Status: DC
  Filled 2023-12-19: qty 15, 6d supply, fill #0

## 2023-12-19 MED ORDER — HYDROXYZINE HCL 25 MG PO TABS
25.0000 mg | ORAL_TABLET | Freq: Once | ORAL | Status: AC
  Administered 2023-12-19: 25 mg via ORAL
  Filled 2023-12-19: qty 1

## 2023-12-19 MED ORDER — PREDNISONE 20 MG PO TABS
40.0000 mg | ORAL_TABLET | Freq: Once | ORAL | Status: AC
  Administered 2023-12-19: 40 mg via ORAL
  Filled 2023-12-19: qty 2

## 2023-12-19 MED ORDER — TRIAMCINOLONE ACETONIDE 0.5 % EX OINT
1.0000 | TOPICAL_OINTMENT | Freq: Two times a day (BID) | CUTANEOUS | 0 refills | Status: DC
  Filled 2023-12-19: qty 30, 60d supply, fill #0

## 2023-12-19 NOTE — ED Triage Notes (Signed)
 Pt c/o itchy rash on bilateral arms that began on Friday and has worsened since, along with bilateral hand swelling with no known allergen or exposure.  Pt denies sob or other symptoms, AAAx4.  Pt has not taken any OTC medications for rash or sx.

## 2023-12-19 NOTE — Discharge Instructions (Addendum)
 You have been prescribed prednisone . Please take this medication as prescribed for the next 7 days (40mg  on days 1 and 2, 30mg  on days 3 and 4, 20mg  on days 5 and 6, 10mg  on day 7). Take this medication in the morning with breakfast, as taking it at night may make it hard to sleep.  You were given your first dose here today.  Please take your next dose tomorrow morning.  You have been prescribed triamcinolone  cream to help with your rash and itching.  You may apply this cream twice daily for up to 1 week.  Do not use this on your face.  You may take Benadryl OR cetirizine (Zyrtec) for your itching.  Cetirizine is the less sedating form of than Benadryl.  These are both over-the-counter medications.  Do not take these medications at the same time.  Please follow-up with your PCP if the rash is not starting to improve significantly within the next 2 to 3 days.  Please return to the emergency room for any severe swelling, spreading redness, fevers, any other new or concerning symptoms

## 2023-12-19 NOTE — ED Provider Notes (Signed)
 [place] EMERGENCY DEPARTMENT AT [facility] Provider Note   CSN: [id number] Arrival date & time: 12/19/23  1514     Patient presents with: Rash   Janet Solomon is a 68 y.o. female with history of Parkinson's disease, presents with concern for an itchy rash on her arms bilaterally. Also reports some itchiness to the upper back.  This started 4 days ago.  She denies any new soaps, lotions, detergents, new medications, parents.  Has not been outside recently.  She has been scratching so much that she has some scabbed over areas on her arms.    Rash      Prior to Admission medications   Medication Sig Start Date End Date Taking? Authorizing Provider  predniSONE  (DELTASONE ) 10 MG tablet Take 4 tablets (40 mg total) by mouth daily with breakfast for 1 day, THEN 3 tablets (30 mg total) daily with breakfast for 2 days, THEN 2 tablets (20 mg total) daily with breakfast for 2 days, THEN 1 tablet (10 mg total) daily with breakfast for 1 day. 12/20/23 12/26/23 Yes Veta Palma, PA-C  triamcinolone  ointment (KENALOG ) 0.5 % Apply to the affected area(s) topically 2 (two) times daily. 12/19/23  Yes Veta Palma, PA-C  alendronate  (FOSAMAX ) 70 MG tablet Take 1 tablet (70 mg total) by mouth every 7 (seven) days. Take with a full glass of water on an empty stomach. Patient taking differently: Take 70 mg by mouth every 7 (seven) days. Take with a full glass of water on an empty stomach. 04/13/23   Shamleffer, Ibtehal Jaralla, MD  carbidopa -levodopa  (SINEMET  IR) 25-100 MG tablet Take 1 tablet by mouth 3 (three) times daily. 10am/2pm/6pm Patient taking differently: Take 1 tablet by mouth 3 (three) times daily. 10am/2pm/6pm 09/02/23   Tat, Asberry RAMAN, DO  DULoxetine  (CYMBALTA ) 20 MG capsule Take 20 mg by mouth at bedtime.    [provider]  estradiol  (ESTRACE  VAGINAL) 0.1 MG/GM vaginal cream Place 1 Applicatorful vaginally at bedtime. Apply a dime size amount in the  vagina nightly until surgery 08/31/23   Glennon Almarie POUR, MD  ibuprofen  (ADVIL ) 800 MG tablet Take 1 tablet (800 mg total) by mouth every 8 (eight) hours as needed. 11/04/23   Glennon Almarie POUR, MD  Magnesium Oxide 420 MG TABS Take 1 tablet by mouth daily.    [provider]  metoCLOPramide  (REGLAN ) 10 MG tablet Take 1 tablet (10 mg total) by mouth every 8 (eight) hours as needed for nausea or vomiting. 11/04/23   Glennon Almarie POUR, MD  oxyCODONE  (ROXICODONE ) 5 MG immediate release tablet Take 1 tablet (5 mg total) by mouth every 6 (six) hours as needed for severe pain (pain score 7-10). 11/04/23   Glennon Almarie POUR, MD  pregabalin  (LYRICA ) 150 MG capsule Take 150 mg by mouth 2 (two) times daily. 09/06/23   [provider]  Vitamin D -Vitamin K (K2-D3 5000) 5000-90 UNIT-MCG CAPS Take 1 capsule by mouth daily after lunch.    [provider]    Allergies: Patient has no known allergies.    Review of Systems  Skin:  Positive for rash.    Updated Vital Signs BP 114/78 (BP Location: Right Arm)   Pulse 91   Temp 98 F (36.7 C) (Oral)   Resp 18   SpO2 96%   Physical Exam Vitals and nursing note reviewed.  Constitutional:      Appearance: Normal appearance.  HENT:     Head: Atraumatic.  Cardiovascular:     Rate  and Rhythm: Normal rate and regular rhythm.     Comments: 2+ radial pulse bilaterally Pulmonary:     Effort: Pulmonary effort is normal.  Musculoskeletal:     Comments: Full range of motion of the bilateral shoulders, elbow, and wrists  Skin:    Comments: Mild erythematous rash on the arms bilaterally.  Patient does have some scabbed over abrasions due to itching the arms and upper back..  No surrounding erythema in these areas, pus drainage.  No significant edema of the bilateral upper extremities.  No rash noted in the mouth, abdomen, back, or legs bilaterally  Neurological:     General: No focal deficit present.     Mental Status: She is  alert.  Psychiatric:        Mood and Affect: Mood normal.        Behavior: Behavior normal.        (all labs ordered are listed, but only abnormal results are displayed) Labs Reviewed - No data to display  EKG: None  Radiology: No results found.   Procedures   Medications Ordered in the ED  predniSONE  (DELTASONE ) tablet 40 mg (has no administration in time range)  hydrOXYzine  (ATARAX ) tablet 25 mg (has no administration in time range)                                    Medical Decision Making Risk Prescription drug management.     Differential diagnosis includes but is not limited to contact dermatitis, shingles, cellulitis, DVT, hives  ED Course:  Upon initial evaluation, patient is appearing, stable vital signs.  Reporting a very itchy rash to the bilateral upper arms.  Patient actively itching on exam.  She does have some abrasions from itching so much, these areas are scabbed over.  There is no surrounding erythema around the scratch sites or pus drainage, have low concern for cellulitis at this time.  She does have a mild erythematous rash on the bilateral upper arms. No edema or pain low concern for DVT.  Given the very itchy nature of this rash, most concern for contact dermatitis at this time. She denies being outside, low concern for poison ivy or poison oak contact dermatitis.  However, unclear trigger as she denies any new soaps, lotions, detergents, foods.  This does not appear to be consistent with DRESS, SJS, or other emergent etiologies of rash.  Patient stable and appropriate for discharge home at this time.  Medications Given: Prednisone  Hydroxyzine   Impression: Rash  Disposition:  The patient was discharged home with instructions to take course of prednisone  as prescribed.  I did confirm with patient she is not diabetic.  May take Benadryl or cetirizine as needed for itching.  May apply triamcinolone  cream to the arms up to 2 times daily for the  next week.  Follow-up with PCP if symptoms not improving within the next 2 to 3 days Return precautions given.    This chart was dictated using voice recognition software, Dragon. Despite the best efforts of this provider to proofread and correct errors, errors may still occur which can change documentation meaning.       Final diagnoses:  Rash    ED Discharge Orders          Ordered    predniSONE  (DELTASONE ) 10 MG tablet  Q breakfast        12/19/23 1613    triamcinolone  ointment (KENALOG )  0.5 %  2 times daily        12/19/23 1613               Veta Palma, PA-C 12/19/23 1617    Dreama Longs, MD 12/20/23 [id number]

## 2023-12-19 NOTE — Telephone Encounter (Signed)
 FYI Only or Action Required?: FYI only for provider.  Patient was last seen in primary care on 09/27/2023 by Lucius Krabbe, NP.  Called Nurse Triage reporting Rash.  Symptoms began Saturday.  Interventions attempted: Rest, hydration, or home remedies.  Symptoms are: unchanged.  Triage Disposition: Go to ED Now (or PCP Triage)  Patient/caregiver understands and will follow disposition?: Yes  Copied from CRM [id number]. Topic: Clinical - Red Word Triage >> Dec 19, 2023  2:24 PM Janet Solomon wrote: Red Word that prompted transfer to Nurse Triage: Rash that has left arm swollen now to other arm, stomach area, very itchy Reason for Disposition  Patient sounds very sick or weak to the triager  Answer Assessment - Initial Assessment Questions 1. APPEARANCE of RASH: What does the rash look like? (e.g., blisters, dry flaky skin, red spots, redness, sores)     Red bumps-patient describes as big welts all over her body. Patient also endorses small red bumps.  2. SIZE: How big are the spots? (e.g., tip of pen, eraser, coin; inches, centimeters)     Side of an unsed pencil 3. LOCATION: Where is the rash located?     Bilateral arms, stomach  4. COLOR: What color is the rash? (Note: It is difficult to assess rash color in people with darker-colored skin. When this situation occurs, simply ask the caller to describe what they see.)     red 5. ONSET: When did the rash begin?     Started on Saturday 6. FEVER: Do you have a fever? If Yes, ask: What is your temperature, how was it measured, and when did it start?     no 7. ITCHING: Does the rash itch? If Yes, ask: How bad is the itch? (Scale 1-10; or mild, moderate, severe)     severe 8. CAUSE: What do you think is causing the rash?     unsure 9. MEDICINE FACTORS: Have you started any new medicines within the last 2 weeks? (e.g., antibiotics)      No new medications 10. OTHER SYMPTOMS: Do you have any other symptoms? (e.g.,  dizziness, headache, sore throat, joint pain)       Slight headache this morning,  Patient endorses that she is scratching significantly to the point that she is bleeding. Patient recently had surgery on 12/02/2023 by OBGYN for ovarian cyst removal. Patient endorses rash started over the weekend. Patient recommended to the ED for evaluation today. Patient didn't schedule a hospital follow up with PCP-appointment made for hospital follow up in regards to surgery.  Protocols used: Rash or Redness - [facility]

## 2023-12-20 ENCOUNTER — Ambulatory Visit: Admitting: Surgery

## 2023-12-20 ENCOUNTER — Encounter: Payer: Self-pay | Admitting: Surgery

## 2023-12-20 VITALS — BP 164/84 | HR 67 | Temp 98.1°F | Resp 16 | Ht 60.0 in | Wt 141.0 lb

## 2023-12-20 DIAGNOSIS — R222 Localized swelling, mass and lump, trunk: Secondary | ICD-10-CM

## 2023-12-20 NOTE — Telephone Encounter (Signed)
 Noted, pt seen in ED
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
## 2023-12-26 ENCOUNTER — Encounter: Payer: Self-pay | Admitting: Family

## 2023-12-26 ENCOUNTER — Ambulatory Visit: Admitting: Family

## 2023-12-26 VITALS — BP 118/68 | HR 64 | Temp 97.2°F | Ht 60.0 in | Wt 140.4 lb

## 2023-12-26 DIAGNOSIS — M7989 Other specified soft tissue disorders: Secondary | ICD-10-CM

## 2023-12-26 DIAGNOSIS — T8130XA Disruption of wound, unspecified, initial encounter: Secondary | ICD-10-CM | POA: Diagnosis not present

## 2023-12-26 DIAGNOSIS — R21 Rash and other nonspecific skin eruption: Secondary | ICD-10-CM
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
## 2023-12-27 ENCOUNTER — Telehealth: Payer: Self-pay | Admitting: *Deleted

## 2023-12-27 NOTE — Telephone Encounter (Signed)
 Spoke with patient.  S/P [facility] BSO 12/02/23.   States scab came off of incision on Right side of abdomen. Reports scan amount of clear drainage. Incision rubs against panties. Denies warmth, redness or swelling. Incision is closed. Has been cleaning with peroxide.   Declined OV today. Would like OV prior to her next surgery on 01/04/24.   OV scheduled for incision check on 8/7 at 1015.   Advised to gently clean incision with antibacterial soap and water and dry. Can apply Aquaphor or neosporin. Cover with loose bandage to reduce irritation caused by panties. Return call to office if any changes.   Advised I will send to Dr. Glennon to review, our office will f/u if any additional recommendations. Patient agreeable.   Routing to provider for final review. Patient is agreeable to disposition. Will close encounter.

## 2023-12-27 NOTE — Telephone Encounter (Signed)
 Janet Solomon  [facility] Triage6 hours ago (9:08 AM)   CS Glennon not in office tomorrow and nothing available for Thursday or Friday. I called patient and offered this afternoon but she stated she can not come in today.     Janet Solomon  P [facility] Admin (supporting Almarie MARLA Glennon, MD)22 hours ago (4:57 PM)    Appointment Request From: Janet Solomon   With Provider: Almarie MARLA Glennon [facility]]   Preferred Date Range: Any date 12/28/2023 or later   Preferred Times: Wednesday Morning   Reason for visit: Office Visit   Health Maintenance Topic:    Comments: One of my incisions is bleeding slightly and there's a clear liquid oozing out. Could you please check it and make sure it's not getting infected. Thank you

## 2023-12-28 NOTE — Telephone Encounter (Signed)
 Patient notified. Encounter closed

## 2023-12-29 ENCOUNTER — Ambulatory Visit: Admitting: Obstetrics and Gynecology

## 2023-12-29 VITALS — BP 124/68 | HR 70 | Wt 138.6 lb

## 2023-12-29 DIAGNOSIS — Z09 Encounter for follow-up examination after completed treatment for conditions other than malignant neoplasm: Secondary | ICD-10-CM

## 2023-12-29 NOTE — Progress Notes (Signed)
   Acute Office Visit  Subjective:    Patient ID: Janet Solomon, female    DOB: 1955/06/20, 68 y.o.   MRN: [id number]   HPI 68 y.o. presents today for Post-op Follow-up ( incisions is bleeding slightly and there's a clear liquid coming out of it. Work in appt. Surgery date 12/02/23. Wants to make sure it isn't infection she is having surgery next week. ) To removal cyst on her chest with general surgery She is doing well. No fevers, VB or severe abdominal pain.  No LMP recorded. Patient has had a hysterectomy.    Review of Systems     Objective:    OBGyn Exam  BP 124/68   Pulse 70   Wt 138 lb 9.6 oz (62.9 kg)   SpO2 98%   BMI 27.07 kg/m  Wt Readings from Last 3 Encounters:  12/29/23 138 lb 9.6 oz (62.9 kg)  12/26/23 140 lb 6 oz (63.7 kg)  12/20/23 141 lb (64 kg)        RLQ incision with small area of redness most likely from stitch trying to dissolve No surrouding erythema or discharge noted  Assessment & Plan:  Abdominal incision check after [facility] Apply neopsporin twice dialy for 7 days.  Keep open and dry.  Return with any worsening s/s or concerns.  RTC in 2 weeks or sooner. Continue pelvic rest and vaginal estrogen. Can increase to twice daily with this to aid in vaginal cuff healing.  Janet Solomon

## 2024-01-02 ENCOUNTER — Encounter (HOSPITAL_COMMUNITY)
Admission: RE | Admit: 2024-01-02 | Discharge: 2024-01-02 | Disposition: A | Discharge status: Home / Self Care | Source: Ambulatory Visit | Attending: Surgery | Admitting: Surgery

## 2024-01-03 ENCOUNTER — Ambulatory Visit: Payer: Medicare Other | Admitting: Physical Therapy

## 2024-01-03 ENCOUNTER — Ambulatory Visit: Payer: Medicare Other | Admitting: Occupational Therapy

## 2024-01-03 ENCOUNTER — Ambulatory Visit: Payer: Medicare Other | Admitting: Speech Pathology

## 2024-01-04 ENCOUNTER — Encounter (HOSPITAL_COMMUNITY)
Admission: RE | Disposition: A | Discharge status: Home / Self Care | Payer: Self-pay | Source: Home / Self Care | Attending: Surgery

## 2024-01-04 ENCOUNTER — Ambulatory Visit (HOSPITAL_COMMUNITY)
Admission: RE | Admit: 2024-01-04 | Discharge: 2024-01-04 | Disposition: A | Discharge status: Home / Self Care | Attending: Surgery | Admitting: Surgery

## 2024-01-04 ENCOUNTER — Ambulatory Visit (HOSPITAL_COMMUNITY): Admitting: Anesthesiology

## 2024-01-04 ENCOUNTER — Ambulatory Visit (HOSPITAL_BASED_OUTPATIENT_CLINIC_OR_DEPARTMENT_OTHER): Admitting: Anesthesiology

## 2024-01-04 DIAGNOSIS — G20A1 Parkinson's disease without dyskinesia, without mention of fluctuations: Secondary | ICD-10-CM | POA: Diagnosis not present

## 2024-01-04 DIAGNOSIS — D235 Other benign neoplasm of skin of trunk: Secondary | ICD-10-CM | POA: Insufficient documentation

## 2024-01-04 DIAGNOSIS — K219 Gastro-esophageal reflux disease without esophagitis: Secondary | ICD-10-CM | POA: Insufficient documentation

## 2024-01-04 DIAGNOSIS — Z87891 Personal history of nicotine dependence: Secondary | ICD-10-CM | POA: Diagnosis not present

## 2024-01-04 DIAGNOSIS — R222 Localized swelling, mass and lump, trunk: Secondary | ICD-10-CM

## 2024-01-04 DIAGNOSIS — C761 Malignant neoplasm of thorax: Secondary | ICD-10-CM | POA: Diagnosis not present

## 2024-01-04 HISTORY — PX: EXCISION MASS ABDOMINAL: SHX6701

## 2024-01-04 SURGERY — EXCISION, MASS, TORSO
Anesthesia: General | Site: Chest | Laterality: Left

## 2024-01-04 MED ORDER — ONDANSETRON HCL 4 MG/2ML IJ SOLN
INTRAMUSCULAR | Status: AC
  Filled 2024-01-04: qty 2

## 2024-01-04 MED ORDER — BUPIVACAINE HCL (PF) 0.5 % IJ SOLN
INTRAMUSCULAR | Status: DC | PRN
  Administered 2024-01-04 (×2): 30 mL

## 2024-01-04 MED ORDER — PHENYLEPHRINE 80 MCG/ML (10ML) SYRINGE FOR IV PUSH (FOR BLOOD PRESSURE SUPPORT)
PREFILLED_SYRINGE | INTRAVENOUS | Status: AC
Start: 2024-01-04 — End: 2024-01-04
  Filled 2024-01-04: qty 10

## 2024-01-04 MED ORDER — DEXAMETHASONE SODIUM PHOSPHATE 10 MG/ML IJ SOLN
INTRAMUSCULAR | Status: DC | PRN
  Administered 2024-01-04 (×2): 5 mg via INTRAVENOUS

## 2024-01-04 MED ORDER — DOCUSATE SODIUM 100 MG PO CAPS
100.0000 mg | ORAL_CAPSULE | Freq: Two times a day (BID) | ORAL | 2 refills | Status: DC
Start: 2024-01-04 — End: 2024-01-18

## 2024-01-04 MED ORDER — LIDOCAINE 2% (20 MG/ML) 5 ML SYRINGE
INTRAMUSCULAR | Status: AC
  Filled 2024-01-04: qty 5

## 2024-01-04 MED ORDER — EPHEDRINE SULFATE-NACL 50-0.9 MG/10ML-% IV SOSY
PREFILLED_SYRINGE | INTRAVENOUS | Status: DC | PRN
Start: 2024-01-04 — End: 2024-01-04
  Administered 2024-01-04 (×2): 5 mg via INTRAVENOUS

## 2024-01-04 MED ORDER — ORAL CARE MOUTH RINSE: 15.0000 mL | Freq: Once | OROMUCOSAL | Status: DC

## 2024-01-04 MED ORDER — DEXMEDETOMIDINE HCL IN NACL 80 MCG/20ML IV SOLN
INTRAVENOUS | Status: AC
  Filled 2024-01-04: qty 20

## 2024-01-04 MED ORDER — CEFAZOLIN SODIUM-DEXTROSE 2-4 GM/100ML-% IV SOLN
2.0000 g | INTRAVENOUS | Status: AC
  Administered 2024-01-04 (×2): 2 g via INTRAVENOUS
  Filled 2024-01-04: qty 100

## 2024-01-04 MED ORDER — LIDOCAINE 2% (20 MG/ML) 5 ML SYRINGE
INTRAMUSCULAR | Status: DC | PRN
Start: 2024-01-04 — End: 2024-01-04
  Administered 2024-01-04 (×2): 50 mg via INTRAVENOUS

## 2024-01-04 MED ORDER — DEXAMETHASONE SODIUM PHOSPHATE 10 MG/ML IJ SOLN
INTRAMUSCULAR | Status: AC
  Filled 2024-01-04: qty 1

## 2024-01-04 MED ORDER — PROPOFOL 10 MG/ML IV BOLUS
INTRAVENOUS | Status: DC | PRN
  Administered 2024-01-04 (×2): 120 mg via INTRAVENOUS
  Administered 2024-01-04 (×2): 30 mg via INTRAVENOUS

## 2024-01-04 MED ORDER — FENTANYL CITRATE PF 50 MCG/ML IJ SOSY: 25.0000 ug | PREFILLED_SYRINGE | INTRAMUSCULAR | Status: DC | PRN

## 2024-01-04 MED ORDER — LACTATED RINGERS IV SOLN: INTRAVENOUS | Status: DC

## 2024-01-04 MED ORDER — EPHEDRINE 5 MG/ML INJ
INTRAVENOUS | Status: AC
  Filled 2024-01-04: qty 5

## 2024-01-04 MED ORDER — PHENYLEPHRINE 80 MCG/ML (10ML) SYRINGE FOR IV PUSH (FOR BLOOD PRESSURE SUPPORT)
PREFILLED_SYRINGE | INTRAVENOUS | Status: DC | PRN
  Administered 2024-01-04: 40 ug via INTRAVENOUS
  Administered 2024-01-04: 80 ug via INTRAVENOUS
  Administered 2024-01-04: 40 ug via INTRAVENOUS
  Administered 2024-01-04: 80 ug via INTRAVENOUS

## 2024-01-04 MED ORDER — FENTANYL CITRATE (PF) 100 MCG/2ML IJ SOLN
INTRAMUSCULAR | Status: DC | PRN
  Administered 2024-01-04 (×2): 25 ug via INTRAVENOUS
  Administered 2024-01-04 (×2): 50 ug via INTRAVENOUS
  Administered 2024-01-04 (×2): 25 ug via INTRAVENOUS

## 2024-01-04 MED ORDER — BUPIVACAINE HCL (PF) 0.5 % IJ SOLN
INTRAMUSCULAR | Status: AC
  Filled 2024-01-04: qty 30

## 2024-01-04 MED ORDER — 0.9 % SODIUM CHLORIDE (POUR BTL) OPTIME
TOPICAL | Status: DC | PRN
Start: 2024-01-04 — End: 2024-01-04
  Administered 2024-01-04 (×2): 1000 mL

## 2024-01-04 MED ORDER — CHLORHEXIDINE GLUCONATE 0.12 % MT SOLN
OROMUCOSAL | Status: DC
Start: 2024-01-04 — End: 2024-01-04
  Filled 2024-01-04: qty 15

## 2024-01-04 MED ORDER — CHLORHEXIDINE GLUCONATE CLOTH 2 % EX PADS: 6.0000 | MEDICATED_PAD | Freq: Once | CUTANEOUS | Status: DC

## 2024-01-04 MED ORDER — PHENYLEPHRINE 80 MCG/ML (10ML) SYRINGE FOR IV PUSH (FOR BLOOD PRESSURE SUPPORT)
PREFILLED_SYRINGE | INTRAVENOUS | Status: AC
  Filled 2024-01-04: qty 10

## 2024-01-04 MED ORDER — CHLORHEXIDINE GLUCONATE 0.12 % MT SOLN: 15.0000 mL | Freq: Once | OROMUCOSAL | Status: DC

## 2024-01-04 MED ORDER — ACETAMINOPHEN 500 MG PO TABS: 1000.0000 mg | ORAL_TABLET | Freq: Four times a day (QID) | ORAL | 0 refills | Status: AC

## 2024-01-04 MED ORDER — OXYCODONE HCL 5 MG PO TABS: 5.0000 mg | ORAL_TABLET | Freq: Four times a day (QID) | ORAL | 0 refills | Status: DC | PRN

## 2024-01-04 MED ORDER — FENTANYL CITRATE (PF) 100 MCG/2ML IJ SOLN
INTRAMUSCULAR | Status: AC
Start: 2024-01-04 — End: 2024-01-04
  Filled 2024-01-04: qty 2

## 2024-01-04 SURGICAL SUPPLY — 25 items
APPLICATOR CHLORAPREP 10.5 ORG (MISCELLANEOUS) ×1 IMPLANT
CLOTH BEACON ORANGE TIMEOUT ST (SAFETY) ×1 IMPLANT
COVER LIGHT HANDLE STERIS (MISCELLANEOUS) ×2 IMPLANT
DERMABOND ADVANCED .7 DNX12 (GAUZE/BANDAGES/DRESSINGS) IMPLANT
DRSG MEPILEX POST OP 4X12 (GAUZE/BANDAGES/DRESSINGS) IMPLANT
DRSG VASELINE 3X18 (GAUZE/BANDAGES/DRESSINGS) IMPLANT
ELECTRODE REM PT RTRN 9FT ADLT (ELECTROSURGICAL) ×1 IMPLANT
GAUZE SPONGE 4X4 12PLY STRL (GAUZE/BANDAGES/DRESSINGS) IMPLANT
GLOVE BIOGEL M 7.0 STRL (GLOVE) IMPLANT
GLOVE BIOGEL PI IND STRL 6.5 (GLOVE) ×1 IMPLANT
GLOVE BIOGEL PI IND STRL 7.0 (GLOVE) ×2 IMPLANT
GLOVE SURG SS PI 6.5 STRL IVOR (GLOVE) ×2 IMPLANT
GOWN STRL REUS W/TWL LRG LVL3 (GOWN DISPOSABLE) ×2 IMPLANT
KIT TURNOVER KIT A (KITS) ×1 IMPLANT
MANIFOLD NEPTUNE II (INSTRUMENTS) ×1 IMPLANT
NDL HYPO 21X1.5 SAFETY (NEEDLE) IMPLANT
NEEDLE HYPO 21X1.5 SAFETY (NEEDLE) ×1 IMPLANT
NS IRRIG 1000ML POUR BTL (IV SOLUTION) ×1 IMPLANT
PACK MINOR (CUSTOM PROCEDURE TRAY) IMPLANT
PAD ARMBOARD POSITIONER FOAM (MISCELLANEOUS) ×1 IMPLANT
POSITIONER HEAD 8X9X4 ADT (SOFTGOODS) ×1 IMPLANT
SET BASIN LINEN APH (SET/KITS/TRAYS/PACK) ×1 IMPLANT
SUT 3-0 BLK 1X30 PSL (SUTURE) IMPLANT
SUT SILK 2 0 SH (SUTURE) IMPLANT
SUT VIC AB 3-0 SH 27X BRD (SUTURE) ×1 IMPLANT

## 2024-01-04 NOTE — Transfer of Care (Signed)
 Immediate Anesthesia Transfer of Care Note  Patient: Janet Solomon  Procedure(s) Performed: EXCISION, MASS, TORSO (Left: Chest)  Patient Location: PACU  Anesthesia Type:General  Level of Consciousness: awake and patient cooperative  Airway & Oxygen Therapy: Patient Spontanous Breathing  Post-op Assessment: Report given to RN and Post -op Vital signs reviewed and stable  Post vital signs: Reviewed and stable  Last Vitals:  Vitals Value Taken Time  BP 123/77 01/04/24 12:39  Temp 97.9 01/04/24  1244  Pulse 71 01/04/24 12:40  Resp 12 01/04/24 12:43  SpO2 100 % 01/04/24 12:40  Vitals shown include unfiled device data.  Last Pain:  Vitals:   01/04/24 0933  TempSrc: Oral  PainSc: 0-No pain      Patients Stated Pain Goal: 5 (01/04/24 0933)  Complications: No notable events documented.

## 2024-01-04 NOTE — Discharge Instructions (Signed)
 Ambulatory Surgery Discharge Instructions  General Anesthesia or Sedation Do not drive or operate heavy machinery for 24 hours.  Do not consume alcohol, tranquilizers, sleeping medications, or any non-prescribed medications for 24 hours. Do not make important decisions or sign any important papers in the next 24 hours. You should have someone with you tonight at home.  Activity  You are advised to go directly home from the hospital.  Restrict your activities and rest for a day.  Resume light activity tomorrow. No heavy lifting over 10 lbs or strenuous exercise.  Fluids and Diet Begin with clear liquids, bouillon, dry toast, soda crackers.  If not nauseated, you may go to a regular diet when you desire.  Greasy and spicy foods are not advised.  Medications  If you have not had a bowel movement in 24 hours, take 2 tablespoons over the counter Milk of mag.             You May resume your blood thinners tomorrow (Aspirin, coumadin, or other).  You are being discharged with prescriptions for Opioid/Narcotic Medications: There are some specific considerations for these medications that you should know. Opioid Meds have risks & benefits. Addiction to these meds is always a concern with prolonged use Take medication only as directed Do not drive while taking narcotic pain medication Do not crush tablets or capsules Do not use a different container than medication was dispensed in Lock the container of medication in a cool, dry place out of reach of children and pets. Opioid medication can cause addiction Do not share with anyone else (this is a felony) Do not store medications for future use. Dispose of them properly.     Disposal:  Find a [place]  household drug take back site near you.  If you can't get to a drug take back site, use the recipe below as a last resort to dispose of expired, unused or unwanted drugs. Disposal  (Do not dispose chemotherapy drugs this way, talk to your  prescribing doctor instead.) Step 1: Mix drugs (do not crush) with dirt, kitty litter, or used coffee grounds and add a small amount of water to dissolve any solid medications. Step 2: Seal drugs in plastic bag. Step 3: Place plastic bag in trash. Step 4: Take prescription container and scratch out personal information, then recycle or throw away.  Operative Site  You have external stitches in place.  These will be removed at your follow up visit.  Ok to English as a second language teacher. Keep wound clean and dry. No baths or swimming. No lifting more than 10 pounds.  Contact Information: If you have questions or concerns, please call our office, [phone], Monday- Thursday 8AM-5PM and Friday 8AM-12Noon.  If it is after hours or on the weekend, please call [facility] Main [facility], [phone], and ask to speak to the surgeon on call for Dr. Evonnie at [facility].   SPECIFIC COMPLICATIONS TO WATCH FOR: Inability to urinate Fever over 101? F by mouth Nausea and vomiting lasting longer than 24 hours. Pain not relieved by medication ordered Swelling around the operative site Increased redness, warmth, hardness, around operative area Numbness, tingling, or cold fingers or toes Blood -soaked dressing, (small amounts of oozing may be normal) Increasing and progressive drainage from surgical area or exam site

## 2024-01-04 NOTE — Anesthesia Postprocedure Evaluation (Signed)
 Anesthesia Post Note  Patient: Janet Solomon  Procedure(s) Performed: EXCISION, MASS, TORSO (Left: Chest)  Patient location during evaluation: PACU Anesthesia Type: General Level of consciousness: awake and alert Pain management: pain level controlled Vital Signs Assessment: post-procedure vital signs reviewed and stable Respiratory status: spontaneous breathing, nonlabored ventilation, respiratory function stable and patient connected to nasal cannula oxygen Cardiovascular status: blood pressure returned to baseline and stable Postop Assessment: no apparent nausea or vomiting Anesthetic complications: no   There were no known notable events for this encounter.   Last Vitals:  Vitals:   01/04/24 1300 01/04/24 1303  BP: 116/69 111/67  Pulse: 65   Resp: 17   Temp:  36.4 C  SpO2: 96%     Last Pain:  Vitals:   01/04/24 1300  TempSrc:   PainSc: 0-No pain                 Aren Pryde L Denarius Sesler

## 2024-01-04 NOTE — Anesthesia Preprocedure Evaluation (Addendum)
 Anesthesia Evaluation  Patient identified by MRN, date of birth, ID band Patient awake    Reviewed: Allergy & Precautions, NPO status , Patient's Chart, lab work & pertinent test results  History of Anesthesia Complications (+) Family history of anesthesia reactionNegative for: history of anesthetic complications  Airway Mallampati: II  TM Distance: >3 FB Neck ROM: Full    Dental  (+) Missing, Dental Advisory Given, Caps,  All upper front teeth are capped:   Pulmonary former smoker   Pulmonary exam normal breath sounds clear to auscultation       Cardiovascular negative cardio ROS Normal cardiovascular exam Rhythm:Regular Rate:Normal     Neuro/Psych Parkinson's dx  Neuromuscular disease  negative psych ROS   GI/Hepatic Neg liver ROS,GERD  Controlled,,  Endo/Other  negative endocrine ROS    Renal/GU negative Renal ROS     Musculoskeletal negative musculoskeletal ROS (+)    Abdominal   Peds  Hematology negative hematology ROS (+)   Anesthesia Other Findings Day of surgery medications reviewed with patient.  Reproductive/Obstetrics cyst of right ovary                              Anesthesia Physical Anesthesia Plan  ASA: 2  Anesthesia Plan: General   Post-op Pain Management: Minimal or no pain anticipated   Induction: Intravenous  PONV Risk Score and Plan: 3 and Ondansetron , Dexamethasone  and Midazolam   Airway Management Planned: LMA  Additional Equipment: None  Intra-op Plan:   Post-operative Plan:   Informed Consent: I have reviewed the patients History and Physical, chart, labs and discussed the procedure including the risks, benefits and alternatives for the proposed anesthesia with the patient or authorized representative who has indicated his/her understanding and acceptance.     Dental advisory given  Plan Discussed with: CRNA  Anesthesia Plan Comments:           Anesthesia Quick Evaluation

## 2024-01-04 NOTE — Interval H&P Note (Signed)
 History and Physical Interval Note:  01/04/2024 11:17 AM  Janet Solomon  has presented today for surgery, with the diagnosis of MASS, CHEST WALL, LEFT, 2.5 CM.  The various methods of treatment have been discussed with the patient and family. After consideration of risks, benefits and other options for treatment, the patient has consented to  Procedure(s) with comments: EXCISION, MASS, TORSO (Left) - CHEST WALL as a surgical intervention.  The patient's history has been reviewed, patient examined, no change in status, stable for surgery.  I have reviewed the patient's chart and labs.  Questions were answered to the patient's satisfaction.     Paden Kuras A Avree Szczygiel

## 2024-01-04 NOTE — Anesthesia Procedure Notes (Signed)
 Procedure Name: LMA Insertion Date/Time: 01/04/2024 11:56 AM  Performed by: Barbarann Verneita RAMAN, CRNAPre-anesthesia Checklist: Patient identified, Patient being monitored, Emergency Drugs available, Timeout performed and Suction available Patient Re-evaluated:Patient Re-evaluated prior to induction Oxygen Delivery Method: Circle System Utilized Preoxygenation: Pre-oxygenation with 100% oxygen Induction Type: IV induction Ventilation: Mask ventilation without difficulty LMA: LMA inserted LMA Size: 3.0 Number of attempts: 1 Placement Confirmation: positive ETCO2 and breath sounds checked- equal and bilateral Tube secured with: Tape

## 2024-01-04 NOTE — Op Note (Signed)
 [facility] Operative Note  01/04/24  Preoperative Diagnosis: Left chest wall mass   Postoperative Diagnosis: Same   Procedure(s) Performed: Excision of left chest wall mass, 2.5 cm   Surgeon: Dorothyann Brittle, DO    Assistants: No qualified resident was available    Anesthesia: LMA   Anesthesiologist: Landry Dunnings, MD    Specimens: Left chest wall mass, short stitch superior, long stitch lateral   Estimated Blood Loss: Minimal   Blood Replacement: None    Complications: None   Wound Class: Clean   Operative Indications: Patient is a 68 year old female who presents for excision of a left chest wall mass.  She had something in this area previously excised 9 years ago, and it has returned.  She has pain associated with this area and she would like it removed at this time.  She is agreeable to surgery  All risks and benefits of performing this procedure were discussed with the patient including pain, infection, bleeding, damage to the surrounding structures, mass recurrence, and need for more procedures or surgery. The patient voiced understanding of the procedure, all questions were sought and answered, and consent was obtained.  Findings: -Left chest wall mass, 2.5 cm   Procedure: The patient was taken to the operating room and placed supine. LMA anesthesia was induced. Intravenous antibiotics were administered per protocol.  The left chest was prepared and draped in the usual sterile fashion.   An elliptical incision was made around the left chest wall mass. Using electrocautery, the dermis and subcutaneous tissues dissected around the mass. The mass was removed in its entirety and sent to pathology for evaluation.  Short stitch marks superior and long stitch marks lateral. Hemostasis was achieved using electrocautery. The dermis was approximated using 3-0 Vicryl sutures. The skin was closed using 3-0 Nylon in a vertical mattress fashion.  The incision  was dressed with vaseline gauze, 4x4s, and medipore tape.  Final inspection revealed acceptable hemostasis. All counts were correct at the end of the case. The patient was awakened from anesthesia without complication.  The patient went to the PACU in stable condition.   Dorothyann Brittle, DO  [facility] [address] [place], [place] [address] [phone] (office)

## 2024-01-04 NOTE — Progress Notes (Signed)
 [facility]  Spoke with the patient's family in the consultation room.  I explained that she tolerated the procedure without difficulty.  She has external stitches in place that will be removed at her follow up visit in 2 weeks.  I discharged her home with a prescription for narcotic pain medication that they should take as needed for pain.  I also want her taking scheduled Tylenol .  If they take the narcotic pain medication, they should take a stool softener as well.  The patient will follow-up with me in 2 weeks.  All questions were answered to their expressed satisfaction.  Janet Brittle, DO [facility] [address] [place], [place] [address] [phone] (office)

## 2024-01-05 ENCOUNTER — Encounter (HOSPITAL_COMMUNITY): Payer: Self-pay | Admitting: Surgery

## 2024-01-09 LAB — SURGICAL PATHOLOGY

## 2024-01-10 ENCOUNTER — Other Ambulatory Visit (HOSPITAL_BASED_OUTPATIENT_CLINIC_OR_DEPARTMENT_OTHER): Payer: Self-pay

## 2024-01-10 DIAGNOSIS — G894 Chronic pain syndrome: Secondary | ICD-10-CM | POA: Diagnosis not present

## 2024-01-10 DIAGNOSIS — M5416 Radiculopathy, lumbar region: Secondary | ICD-10-CM | POA: Diagnosis not present

## 2024-01-10 DIAGNOSIS — G4700 Insomnia, unspecified: Secondary | ICD-10-CM | POA: Diagnosis not present

## 2024-01-10 DIAGNOSIS — M47816 Spondylosis without myelopathy or radiculopathy, lumbar region: Secondary | ICD-10-CM | POA: Diagnosis not present

## 2024-01-10 MED ORDER — PREGABALIN 150 MG PO CAPS
150.0000 mg | ORAL_CAPSULE | Freq: Two times a day (BID) | ORAL | 1 refills | Status: DC
  Filled 2024-01-17: qty 60, 30d supply, fill #0
  Filled 2024-02-15: qty 60, 30d supply, fill #1

## 2024-01-16 ENCOUNTER — Telehealth (INDEPENDENT_AMBULATORY_CARE_PROVIDER_SITE_OTHER): Admitting: Surgery

## 2024-01-16 DIAGNOSIS — D239 Other benign neoplasm of skin, unspecified: Secondary | ICD-10-CM

## 2024-01-16 NOTE — Telephone Encounter (Signed)
 [facility]  Called to update the patient regarding the pathology report.  No answer, Voicemail left.  Will plan to update patient regarding pathology results on Wednesday at follow up appointment.  Discussed case with Dr. Davonna who recommends the patient follow up with her to evaluate if she needs further treatment.  Pathology: A. LEFT CHEST WALL MASS, EXCISION: -  Sweat gland neoplasm consistent with spiradenoma, completely excised.  The lesion consists of a biphasic solid and cystic tumor.  The lesion is centered in the dermis and consists of a centrally located pale/eosinophilic cells with focal lumen formation as well as darker basaloid cells at the periphery arranged in a trabecular/reticular pattern.  There is prominent vascularity between the intervention being tubular/ductal structures.  The epithelial component stains positive for EMA, CD117, CK7, p16 and CK5/6.  The myoepithelial component is positive for p63, GFAP and focally S100.  In the papillary and CEA are essentially negative.  There is no significant mitotic activity and the proliferation rate by Ki-67 is low.  Expert dermatopathology consultation was obtained by Dr. Lorriane Curry who agrees with the interpretation.   Dorothyann Brittle, DO [facility] [address], [place] [address] [phone] (office)

## 2024-01-17 ENCOUNTER — Other Ambulatory Visit (HOSPITAL_BASED_OUTPATIENT_CLINIC_OR_DEPARTMENT_OTHER): Payer: Self-pay

## 2024-01-18 ENCOUNTER — Other Ambulatory Visit (HOSPITAL_BASED_OUTPATIENT_CLINIC_OR_DEPARTMENT_OTHER): Payer: Self-pay

## 2024-01-18 ENCOUNTER — Ambulatory Visit (INDEPENDENT_AMBULATORY_CARE_PROVIDER_SITE_OTHER): Admitting: Surgery

## 2024-01-18 VITALS — BP 131/80 | HR 65 | Temp 97.9°F | Resp 14 | Ht 60.0 in | Wt 143.0 lb

## 2024-01-18 DIAGNOSIS — Z09 Encounter for follow-up examination after completed treatment for conditions other than malignant neoplasm: Secondary | ICD-10-CM

## 2024-01-18 DIAGNOSIS — D239 Other benign neoplasm of skin, unspecified: Secondary | ICD-10-CM

## 2024-01-18 NOTE — Progress Notes (Unsigned)
 [facility] Note   HPI:  68 y.o. Female presents to clinic for post-op follow-up s/p excision of left chest wall mass.  Patient has been doing well since the surgery.  She has some occasional discomfort from stitches pulling on clothes, but otherwise denies pain.  Denies issues with the incision site.  Denies fever and chills.  Review of Systems:  All other review of systems: otherwise negative   Vital Signs:  BP 131/80   Pulse 65   Temp 97.9 F (36.6 C) (Oral)   Resp 14   Ht 5' (1.524 m)   Wt 143 lb (64.9 kg)   SpO2 96%   BMI 27.93 kg/m    Physical Exam:  Physical Exam  Laboratory studies: None   Imaging:  None  Pathology: A. LEFT CHEST WALL MASS, EXCISION: -  Sweat gland neoplasm consistent with spiradenoma, completely excised.  The lesion consists of a biphasic solid and cystic tumor.  The lesion is centered in the dermis and consists of a centrally located pale/eosinophilic cells with focal lumen formation as well as darker basaloid cells at the periphery arranged in a trabecular/reticular pattern.  There is prominent vascularity between the intervention being tubular/ductal structures.  The epithelial component stains positive for EMA, CD117, CK7, p16 and CK5/6.  The myoepithelial component is positive for p63, GFAP and focally S100.  In the papillary and CEA are essentially negative.  There is no significant mitotic activity and the proliferation rate by Ki-67 is low.  Expert dermatopathology consultation was obtained by Dr. Lorriane Curry who agrees with the interpretation.   Assessment:  68 y.o. yo Female with ***.  Plan:  - ***  - *** - *** - Follow up  All of the above recommendations were discussed with the patient and patient's family, and all of patient's and family's questions were answered to their expressed satisfaction.  Note: Portions of this report may have been transcribed using voice recognition software. Every effort has  been made to ensure accuracy; however, inadvertent computerized transcription errors may still be present.   Dorothyann Brittle, DO [facility] [address] [place], [place] [address] [phone] (office)

## 2024-01-20 ENCOUNTER — Other Ambulatory Visit (HOSPITAL_COMMUNITY)

## 2024-01-25 ENCOUNTER — Encounter: Payer: Self-pay | Admitting: *Deleted

## 2024-01-26 ENCOUNTER — Telehealth: Payer: Self-pay | Admitting: *Deleted

## 2024-01-31 ENCOUNTER — Ambulatory Visit (INDEPENDENT_AMBULATORY_CARE_PROVIDER_SITE_OTHER): Admitting: Obstetrics and Gynecology

## 2024-01-31 VITALS — BP 100/60 | HR 75 | Wt 141.0 lb

## 2024-01-31 DIAGNOSIS — Z09 Encounter for follow-up examination after completed treatment for conditions other than malignant neoplasm: Secondary | ICD-10-CM
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
## 2024-01-31 NOTE — Progress Notes (Signed)
 Patient presents for 10 week postop from [facility], BSO, cystoscopy. She is doing well. No fevers, VB, dysuria or severe abdominal pain.  Recent chest wall mass removed with general surgery and showed sweat gland neoplasm, spiradenoma. She will see oncology to review result soon and to manage.  BP 100/60   Pulse 75   Wt 141 lb (64 kg)   SpO2 97%   BMI 27.54 kg/m   SVE: sutures seen and dissolving, normal discharge, no bleeding  A/p PO from [facility] 10 weeks doing well Resume activities 2. Pelvic rest for additional 3 wks. She is not sexually active, per patient. To decrease vaginal estrogen cream to 3 times a week now from here on it. 3. Encouraged annual mammograms and resume annual care. RTC with any concerns  Dr. Glennon

## 2024-02-01 ENCOUNTER — Inpatient Hospital Stay

## 2024-02-01 ENCOUNTER — Inpatient Hospital Stay: Attending: Oncology | Admitting: Oncology

## 2024-02-01 ENCOUNTER — Encounter: Payer: Self-pay | Admitting: Oncology

## 2024-02-01 VITALS — BP 132/72 | HR 73 | Temp 97.3°F | Resp 18 | Wt 140.0 lb

## 2024-02-01 DIAGNOSIS — Z90722 Acquired absence of ovaries, bilateral: Secondary | ICD-10-CM | POA: Insufficient documentation

## 2024-02-01 DIAGNOSIS — Z79899 Other long term (current) drug therapy: Secondary | ICD-10-CM | POA: Diagnosis not present

## 2024-02-01 DIAGNOSIS — D235 Other benign neoplasm of skin of trunk: Secondary | ICD-10-CM | POA: Insufficient documentation

## 2024-02-01 DIAGNOSIS — Z803 Family history of malignant neoplasm of breast: Secondary | ICD-10-CM | POA: Insufficient documentation

## 2024-02-01 DIAGNOSIS — Z806 Family history of leukemia: Secondary | ICD-10-CM | POA: Insufficient documentation

## 2024-02-01 DIAGNOSIS — Z90710 Acquired absence of both cervix and uterus: Secondary | ICD-10-CM | POA: Diagnosis not present

## 2024-02-01 DIAGNOSIS — Z9079 Acquired absence of other genital organ(s): Secondary | ICD-10-CM | POA: Insufficient documentation

## 2024-02-01 DIAGNOSIS — D649 Anemia, unspecified: Secondary | ICD-10-CM | POA: Diagnosis not present

## 2024-02-01 DIAGNOSIS — K219 Gastro-esophageal reflux disease without esophagitis: Secondary | ICD-10-CM | POA: Insufficient documentation

## 2024-02-01 DIAGNOSIS — G20A1 Parkinson's disease without dyskinesia, without mention of fluctuations: Secondary | ICD-10-CM | POA: Diagnosis not present

## 2024-02-01 DIAGNOSIS — D239 Other benign neoplasm of skin, unspecified: Secondary | ICD-10-CM | POA: Insufficient documentation

## 2024-02-01 DIAGNOSIS — Z7983 Long term (current) use of bisphosphonates: Secondary | ICD-10-CM | POA: Insufficient documentation

## 2024-02-01 NOTE — Assessment & Plan Note (Signed)
 Spiradenoma removed with negative margins  - Discussed with the patient that this is a benign tumor and does not need any further radiation treatment as it did not have any positive margins. - Also discussed that some sweat gland tumors also mimic breast tissue and the need for screening mammogram.  Patient also has family history of breast cancer in 2 sisters.  Patient refused to do mammogram at this time. - I encouraged the patient to do self breast exams and consider mammogram in future  No further oncologic workup needed at this time.  Can consider surgery or radiation at recurrence.  Will discharge from oncology clinic at this time.  Recommended patient to reach out to us  in future with questions or concerns.

## 2024-02-01 NOTE — Patient Instructions (Signed)
 [facility]  Discharge Instructions  You were seen and examined today by Dr. Davonna. Dr. Davonna is a medical oncologist, meaning that she specializes in the treatment of cancer diagnoses. Dr. Davonna discussed your past medical history, family history of cancers, and the events that led to you being here today.  You were referred to Dr. Davonna for a sweat gland tumor, which is benign.  The recommendation is for you to continue cancer screenings, including mammogram.  You can call us  if you need anything in the future.  Thank you for choosing [facility] to provide your oncology and hematology care.   To afford each patient quality time with our provider, please arrive at least 15 minutes before your scheduled appointment time. You may need to reschedule your appointment if you arrive late (10 or more minutes). Arriving late affects you and other patients whose appointments are after yours.  Also, if you miss three or more appointments without notifying the office, you may be dismissed from the clinic at the provider's discretion.    Again, thank you for choosing [facility].  Our hope is that these requests will decrease the amount of time that you wait before being seen by our physicians.   If you have a lab appointment with the Cancer Center - please note that after April 8th, all labs will be drawn in the cancer center.  You do not have to check in or register with the main entrance as you have in the past but will complete your check-in at the cancer center.            _____________________________________________________________  Should you have questions after your visit to [facility], please contact our office at [phone] and follow the prompts.  Our office hours are 8:00 a.m. to 4:30 p.m. Monday - Thursday and 8:00 a.m. to 2:30 p.m. Friday.  Please note that voicemails left after 4:00 p.m. may not  be returned until the following business day.  We are closed weekends and all major holidays.  You do have access to a nurse 24-7, just call the main number to the clinic [phone] and do not press any options, hold on the line and a nurse will answer the phone.    For prescription refill requests, have your pharmacy contact our office and allow 72 hours.    Masks are no longer required in the cancer centers. If you would like for your care team to wear a mask while they are taking care of you, please let them know. You may have one support person who is at least 70 years old accompany you for your appointments.

## 2024-02-06 ENCOUNTER — Telehealth: Payer: Medicare Other | Admitting: Internal Medicine

## 2024-02-09 ENCOUNTER — Encounter: Admitting: Obstetrics and Gynecology

## 2024-02-15 ENCOUNTER — Other Ambulatory Visit (HOSPITAL_BASED_OUTPATIENT_CLINIC_OR_DEPARTMENT_OTHER): Payer: Self-pay

## 2024-03-02 ENCOUNTER — Encounter (HOSPITAL_COMMUNITY): Payer: Self-pay

## 2024-03-02 ENCOUNTER — Ambulatory Visit (HOSPITAL_COMMUNITY)
Admission: RE | Admit: 2024-03-02 | Discharge: 2024-03-02 | Disposition: A | Discharge status: Home / Self Care | Source: Ambulatory Visit | Attending: Internal Medicine | Admitting: Internal Medicine

## 2024-03-02 DIAGNOSIS — R9089 Other abnormal findings on diagnostic imaging of central nervous system: Secondary | ICD-10-CM

## 2024-03-03 ENCOUNTER — Ambulatory Visit (HOSPITAL_COMMUNITY)
Admission: RE | Admit: 2024-03-03 | Discharge: 2024-03-03 | Disposition: A | Discharge status: Home / Self Care | Source: Ambulatory Visit | Attending: Internal Medicine | Admitting: Internal Medicine

## 2024-03-03 DIAGNOSIS — R9089 Other abnormal findings on diagnostic imaging of central nervous system: Secondary | ICD-10-CM | POA: Diagnosis not present

## 2024-03-03 DIAGNOSIS — G939 Disorder of brain, unspecified: Secondary | ICD-10-CM | POA: Diagnosis not present

## 2024-03-03 MED ORDER — GADOBUTROL 1 MMOL/ML IV SOLN
6.0000 mL | Freq: Once | INTRAVENOUS | Status: AC | PRN
  Administered 2024-03-03: 6 mL via INTRAVENOUS

## 2024-03-05 ENCOUNTER — Inpatient Hospital Stay: Attending: Oncology | Admitting: Internal Medicine

## 2024-03-05 DIAGNOSIS — R9089 Other abnormal findings on diagnostic imaging of central nervous system: Secondary | ICD-10-CM

## 2024-03-05 NOTE — Progress Notes (Signed)
 I connected with Janet Solomon on 03/05/24 at 10:00 AM EDT by telephone visit and verified that I am speaking with the correct person using two identifiers.  I discussed the limitations, risks, security and privacy concerns of performing an evaluation and management service by telemedicine and the availability of in-person appointments. I also discussed with the patient that there may be a patient responsible charge related to this service. The patient expressed understanding and agreed to proceed.   Other persons participating in the visit and their role in the encounter:  n/a  Patient's location:  Home Provider's location:  Office Chief Complaint:  No diagnosis found.  History of Present Ilness: Janet Solomon reports no clinical changes today.  No headaches, seizures.  She continues to follow with general neurology for Parkinsons.    Observations: Language and cognition at baseline  Imaging:  [facility] Clinician Interpretation: I have personally reviewed the CNS images as listed.  My interpretation, in the context of the patient's clinical presentation, is stable disease  MR BRAIN W WO CONTRAST Result Date: 03/03/2024 EXAM: MRI BRAIN WITH AND WITHOUT CONTRAST 03/03/2024 10:41:41 AM TECHNIQUE: Multiplanar multisequence MRI of the head/brain was performed with and without the administration of 6 ml Gadavist  (gadobutrol  1 MMOL/ML injection). COMPARISON: 07/07/2023 CLINICAL HISTORY: Brain/CNS neoplasm, assess treatment response. Abnormal finding on MRI of brain. FINDINGS: BRAIN AND VENTRICLES: Multifocal hyperintense T2-weighted signal within the cerebral white matter, most commonly due to chronic small vessel disease. 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 No acute infarct, acute intracranial hemorrhage, mass effect, midline shift, or hydrocephalus. The sella is unremarkable. Normal flow voids. No mass or abnormal enhancement. ORBITS: No acute abnormality. SINUSES: No  acute abnormality. BONES AND SOFT TISSUES: Unchanged appearance of osseous lesions at the right frontal and left parietal calvarium. There is minimal associated contrast enhancement. No acute soft tissue abnormality. IMPRESSION: 1. Unchanged appearance of right frontal and left parietal skull lesions. Benign processes remain favored. 2. No acute intracranial abnormality. 3. 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 Electronically signed by: Franky Stanford MD 03/03/2024 11:22 AM EDT RP Workstation: [id number]   Assessment and Plan: Abnormal brain MRI  Clinically stable.  Skull lesions stable over 1 year, consistent with benign process.   Follow Up Instructions: RTC as needed, no further imaging  I discussed the assessment and treatment plan with the patient.  The patient was provided an opportunity to ask questions and all were answered.  The patient agreed with the plan and demonstrated understanding of the instructions.    The patient was advised to call back or seek an in-person evaluation if the symptoms worsen or if the condition fails to improve as anticipated.    Hailey Miles K Anabelle Bungert, MD   I provided 20 minutes of non face-to-face telephone visit time during this encounter, and > 50% was spent counseling as documented under my assessment & plan.

## 2024-03-11 ENCOUNTER — Encounter: Payer: Self-pay | Admitting: Internal Medicine

## 2024-03-12 ENCOUNTER — Other Ambulatory Visit: Payer: Self-pay

## 2024-03-12 MED ORDER — ALENDRONATE SODIUM 70 MG PO TABS: 70.0000 mg | ORAL_TABLET | ORAL | 1 refills | Status: DC

## 2024-03-13 ENCOUNTER — Other Ambulatory Visit (HOSPITAL_BASED_OUTPATIENT_CLINIC_OR_DEPARTMENT_OTHER): Payer: Self-pay

## 2024-03-13 DIAGNOSIS — G4700 Insomnia, unspecified: Secondary | ICD-10-CM | POA: Diagnosis not present

## 2024-03-13 DIAGNOSIS — G894 Chronic pain syndrome: Secondary | ICD-10-CM | POA: Diagnosis not present

## 2024-03-13 DIAGNOSIS — M5416 Radiculopathy, lumbar region: Secondary | ICD-10-CM | POA: Diagnosis not present

## 2024-03-13 DIAGNOSIS — M47816 Spondylosis without myelopathy or radiculopathy, lumbar region: Secondary | ICD-10-CM | POA: Diagnosis not present

## 2024-03-13 MED ORDER — PREGABALIN 150 MG PO CAPS
150.0000 mg | ORAL_CAPSULE | Freq: Two times a day (BID) | ORAL | 1 refills | Status: AC
Start: 2024-03-13 — End: ?
  Filled 2024-03-13: qty 60, 30d supply, fill #0
  Filled 2024-04-16: qty 60, 30d supply, fill #1

## 2024-03-13 MED ORDER — OXYCODONE HCL 5 MG PO TABS
5.0000 mg | ORAL_TABLET | Freq: Every day | ORAL | 0 refills | Status: AC | PRN
Start: 2024-03-13 — End: ?
  Filled 2024-03-13: qty 30, 30d supply, fill #0

## 2024-03-14 ENCOUNTER — Other Ambulatory Visit: Payer: Self-pay

## 2024-03-14 ENCOUNTER — Encounter: Payer: Self-pay | Admitting: Obstetrics and Gynecology

## 2024-03-14 ENCOUNTER — Other Ambulatory Visit: Payer: Self-pay | Admitting: Obstetrics and Gynecology

## 2024-03-14 MED ORDER — JUBBONTI 60 MG/ML ~~LOC~~ SOSY
60.0000 mg | PREFILLED_SYRINGE | SUBCUTANEOUS | 6 refills | Status: AC
  Filled 2024-03-14 (×2): qty 1, 180d supply, fill #0

## 2024-03-14 NOTE — Progress Notes (Signed)
 Pharmacy Patient Advocate Encounter  Insurance verification completed.   The patient is insured through [employer] claim for Jubbonti. Co-pay is $4.90.  This test claim was processed through [facility]- copay amounts may vary at other pharmacies due to pharmacy/plan contracts, or as the patient moves through the different stages of their insurance plan.

## 2024-03-14 NOTE — Telephone Encounter (Signed)
 Please advise. Patient has [facility] Medicare and no longer covering Prolia . Patient has appointment 04/03/24 with you.   Routing to provider for review.

## 2024-03-14 NOTE — Progress Notes (Signed)
 Specialty Pharmacy Initial Fill Coordination Note  Janet Solomon is a 68 y.o. female contacted today regarding initial fill of specialty medication(s) Denosumab -bbdz (Jubbonti)   Patient requested Courier to Provider Office   Delivery date: 03/19/24   Verified address: [facility] [address] [phone]   Medication will be filled on 10/24.   Patient is aware of $4.90 copayment.

## 2024-03-16 ENCOUNTER — Other Ambulatory Visit: Payer: Self-pay

## 2024-03-21 ENCOUNTER — Ambulatory Visit (INDEPENDENT_AMBULATORY_CARE_PROVIDER_SITE_OTHER)

## 2024-03-21 VITALS — Ht 60.0 in | Wt 140.0 lb

## 2024-03-21 DIAGNOSIS — Z0000 Encounter for general adult medical examination without abnormal findings: Secondary | ICD-10-CM | POA: Diagnosis not present

## 2024-03-21 NOTE — Patient Instructions (Signed)
 Janet Solomon,  Thank you for taking the time for your Medicare Wellness Visit. I appreciate your continued commitment to your health goals. Please review the care plan we discussed, and feel free to reach out if I can assist you further.  Medicare recommends these wellness visits once per year to help you and your care team stay ahead of potential health issues. These visits are designed to focus on prevention, allowing your provider to concentrate on managing your acute and chronic conditions during your regular appointments.  Please note that Annual Wellness Visits do not include a physical exam. Some assessments may be limited, especially if the visit was conducted virtually. If needed, we may recommend a separate in-person follow-up with your provider.  Ongoing Care Seeing your primary care provider every 3 to 6 months helps us  monitor your health and provide consistent, personalized care.   Referrals If a referral was made during today's visit and you haven't received any updates within two weeks, please contact the referred provider directly to check on the status.  Recommended Screenings:  Health Maintenance  Topic Date Due   DTaP/Tdap/Td vaccine (1 - Tdap) Never done   Zoster (Shingles) Vaccine (1 of 2) Never done   Breast Cancer Screening  Never done   Colon Cancer Screening  05/29/2022   Medicare Annual Wellness Visit  03/29/2024   Pneumococcal Vaccine for age over 69 (1 of 1 - PCV) 09/26/2024*   Hepatitis C Screening  09/26/2024*   DEXA scan (bone density measurement)  Completed   Meningitis B Vaccine  Aged Out   Flu Shot  Discontinued   COVID-19 Vaccine  Discontinued  *Topic was postponed. The date shown is not the original due date.       02/01/2024   11:23 AM  Advanced Directives  Does Patient Have a Medical Advance Directive? No  Would patient like information on creating a medical advance directive? No - Patient declined   Advance Care Planning is important  because it: Ensures you receive medical care that aligns with your values, goals, and preferences. Provides guidance to your family and loved ones, reducing the emotional burden of decision-making during critical moments.  Vision: Annual vision screenings are recommended for early detection of glaucoma, cataracts, and diabetic retinopathy. These exams can also reveal signs of chronic conditions such as diabetes and high blood pressure.  Dental: Annual dental screenings help detect early signs of oral cancer, gum disease, and other conditions linked to overall health, including heart disease and diabetes.  Please see the attached documents for additional preventive care recommendations.
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
## 2024-03-22 ENCOUNTER — Ambulatory Visit: Attending: Neurology | Admitting: Physical Therapy

## 2024-03-22 ENCOUNTER — Telehealth: Payer: Self-pay | Admitting: Occupational Therapy

## 2024-03-22 ENCOUNTER — Other Ambulatory Visit: Payer: Self-pay | Admitting: *Deleted

## 2024-03-22 ENCOUNTER — Encounter: Payer: Self-pay | Admitting: Physical Therapy

## 2024-03-22 ENCOUNTER — Ambulatory Visit: Admitting: Occupational Therapy

## 2024-03-22 DIAGNOSIS — R2689 Other abnormalities of gait and mobility: Secondary | ICD-10-CM | POA: Insufficient documentation

## 2024-03-22 DIAGNOSIS — R278 Other lack of coordination: Secondary | ICD-10-CM | POA: Insufficient documentation

## 2024-03-22 DIAGNOSIS — M8580 Other specified disorders of bone density and structure, unspecified site: Secondary | ICD-10-CM

## 2024-03-22 MED ORDER — DENOSUMAB-BBDZ 60 MG/ML ~~LOC~~ SOSY
60.0000 mg | PREFILLED_SYRINGE | Freq: Once | SUBCUTANEOUS | Status: AC
  Administered 2024-04-04: 60 mg via SUBCUTANEOUS

## 2024-03-22 NOTE — Therapy (Signed)
 [facility] [address] [place], [place], [address] Phone: [phone]   Fax:  [phone]  Patient Details  Name: Janet Solomon MRN: [id number] Date of Birth: [birth year] Referring Provider:  Lucius Krabbe, NP  Encounter Date: 03/22/2024  Physical Therapy Parkinson's Disease Screen   Timed Up and Go test:6.4 seconds  10 meter walk test:8.8 seconds = 3.72 ft/sec  5 time sit to stand test:9.4 seconds with no UE support   Pt saw pain management and has been doing better with her back and sciatica. Sometimes taking Tylenol  or oxycodone . Only takes the pain meds if it is absolutely necessary. Not doing anything for exercise. I am a couch potato. No falls and no changes in her balance.   Patient would benefit from Physical Therapy evaluation due to last time pt here for PD specific therapy, pt very limited by her low back pain. Now that pt's back pain is more under control, would benefit from PD specific therapy.     [place] LOISE Senate, PT, DPT 03/22/2024, 9:51 AM  [facility] [address] [place], [place], [address] Phone: [phone]   Fax:  [phone]

## 2024-03-22 NOTE — Therapy (Signed)
 [facility] [address] [place], [place], [address] Phone: [phone]   Fax:  [phone]  Patient Details  Name: Janet Solomon MRN: [id number] Date of Birth: 1955-06-19 Referring Provider:  Lucius Krabbe, NP  Encounter Date: 03/22/2024  Occupational Therapy Parkinson's Disease Screen  Hand dominance:  Rt   Physical Performance Test item #2 (simulated eating):  10.30 sec  Physical Performance Test item #4 (donning/doffing jacket):  9.20 sec  Fastening/unfastening 3 buttons in:  17.37 sec  9-hole peg test:    RUE  23.25 sec        LUE  26.04 sec  Change in ability to perform ADLs/IADLs:  difficulty with grandchild's small buttons but not her own. 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 Lt tremors (mostly resting but some action). Pt also has sciatica lower Rt side of back.  BUE AROM WNL's  Pt would benefit from occupational therapy evaluation due to  tremor Lt side and slight decrease in coordination and difficulty with small buttons. Pt also has never had O.T. in the past and would benefit from proactive approach to minimize progression of PD symptoms    Burnard JINNY Roads, OT 03/22/2024, 7:55 AM  [facility] [address] [place], [place], [address] Phone: [phone]   Fax:  [phone]

## 2024-03-22 NOTE — Telephone Encounter (Signed)
 Patient scheduled. See [facility] referral.  Encounter closed.

## 2024-03-22 NOTE — Telephone Encounter (Signed)
 Dr. Evonnie, Bartholome Castles was screened by P.T. and O.T. on 03/22/24.  The patient would benefit from PT and OT evaluations for Parkinson's disease due to mild deficits, never completing P.T. program in the past, and never having O.T. in the past.  If you agree, please place an order to [facility] on [address] via EPIC or fax the order to [phone]. I believe you see her in 4 days, so feel free to send the referrals then if you prefer.  Thank you, Burnard Roads, OTR/L  [facility] [address] [place], [place]  [address] Phone:  [phone] Fax:  [phone]

## 2024-03-26 ENCOUNTER — Encounter: Payer: Self-pay | Admitting: Radiology

## 2024-03-26 NOTE — Progress Notes (Unsigned)
 Assessment/Plan:   Parkinsons disease, diagnosed November, 2024             - Continue carbidopa /levodopa  25/100, 1 tablet 3 times per day.  Refilled today  - Patient does have family history of Parkinson's disease (sister)  -discussed exercising, which she is not really doing much of and told her she needed to increase that  -Referral sent to PT/OT today  -We discussed that it used to be thought that levodopa  would increase risk of melanoma but now it is believed that Parkinsons itself likely increases risk of melanoma. she is to get regular skin checks.     2.  chronic LBP             -limits want/ability to exercise  -follows with pain management and now on oxycodone    3.  Calvarial lesions of unclear etiology  - Following with Dr. Buckley, but those lesions are stable and no further workup needed.   Subjective:   Janet Solomon was seen today in follow up for Parkinsons disease.  My previous records were reviewed prior to todays visit as well as outside records available to me. Pt with daughter in law who supplements hx.   patient is currently on levodopa .  She is tolerating the medication well, without side effects.  I did recently talk with patient's occupational therapist and they requested I send new referrals for physical and Occupational Therapy today.  She has had no near syncope.  She last saw Dr. Buckley October 13 and those notes are reviewed.  She also had a hysterectomy at the end of July.  She is not exercising  Current prescribed movement disorder medications: Carbidopa /levodopa  25/100, 1 tablet 3 times per day.    ALLERGIES:  No Known Allergies  CURRENT MEDICATIONS:  Current Meds  Medication Sig   alendronate  (FOSAMAX ) 70 MG tablet Take 1 tablet (70 mg total) by mouth every 7 (seven) days. Take with a full glass of water on an empty stomach.   carbidopa -levodopa  (SINEMET  IR) 25-100 MG tablet Take 1 tablet by mouth 3 (three) times daily. 10am/2pm/6pm    estradiol  (ESTRACE  VAGINAL) 0.1 MG/GM vaginal cream Place 1 Applicatorful vaginally at bedtime. Apply a dime size amount in the vagina nightly until surgery   MAGNESIUM OXIDE PO Take 1 tablet by mouth in the morning.   oxyCODONE  (OXY IR/ROXICODONE ) 5 MG immediate release tablet Take 1 tablet (5 mg total) by mouth daily as needed.   pregabalin  (LYRICA ) 150 MG capsule Take 1 capsule (150 mg total) by mouth every 12 (twelve) hours.   Vitamin D -Vitamin K (D3 + K2 PO) Take 1 tablet by mouth in the morning.   Current Facility-Administered Medications for the 03/27/24 encounter (Office Visit) with Shameka Aggarwal, Asberry RAMAN, DO  Medication   denosumab  (PROLIA ) injection 60 mg   [START ON 04/04/2024] denosumab -bbdz (JUBBONTI) injection 60 mg     Objective:   PHYSICAL EXAMINATION:    VITALS:   Vitals:   03/27/24 1051  BP: 124/82  Pulse: 77  SpO2: 98%  Weight: 140 lb (63.5 kg)  Height: 5' (1.524 m)     GEN:  The patient appears stated age and is in NAD. HEENT:  Normocephalic, atraumatic.  The mucous membranes are moist. The superficial temporal arteries are without ropiness or tenderness. CV:  RRR Lungs:  CTAB Neck/HEME:  There are no carotid bruits bilaterally.  Neurological examination:  Orientation: The patient is alert and oriented x3. Cranial nerves: There is good facial  symmetry with good facial hypomimia. The speech is fluent and clear. Soft palate rises symmetrically and there is no tongue deviation. Hearing is intact to conversational tone. Sensation: Sensation is intact to light touch throughout Motor: Strength is at least antigravity x4.  Movement examination: Tone: There is mild increased tone in the LUE/LLE (stable) Abnormal movements: there is mild LUE rest tremor Coordination:  There is no decremation with any form of RAMS, including alternating supination and pronation of the forearm, hand opening and closing, finger taps, heel taps and toe taps.  Gait and Station: The  patient has no difficulty arising out of a deep-seated chair without the use of the hands. The patient's stride length is good with improved arm swing today.      I have reviewed and interpreted the following labs independently    Chemistry      Component Value Date/Time   NA 141 04/13/2023 0848   K 4.3 04/13/2023 0848   CL 106 04/13/2023 0848   CO2 29 04/13/2023 0848   BUN 10 04/13/2023 0848   CREATININE 0.92 04/13/2023 0848      Component Value Date/Time   CALCIUM 9.1 04/13/2023 0848   ALKPHOS 76 04/07/2022 1039   AST 20 04/07/2022 1039   ALT 12 04/07/2022 1039   BILITOT 0.8 04/07/2022 1039       Lab Results  Component Value Date   WBC 8.3 11/30/2023   HGB 12.1 11/30/2023   HCT 37.4 11/30/2023   MCV 86.2 11/30/2023   PLT 267 11/30/2023    Lab Results  Component Value Date   TSH 2.57 09/28/2022     Cc:  Lucius Krabbe, NP

## 2024-03-27 ENCOUNTER — Ambulatory Visit: Admitting: Neurology

## 2024-03-27 VITALS — BP 124/82 | HR 77 | Ht 60.0 in | Wt 140.0 lb

## 2024-03-27 DIAGNOSIS — M545 Low back pain, unspecified: Secondary | ICD-10-CM | POA: Diagnosis not present

## 2024-03-27 DIAGNOSIS — G20A1 Parkinson's disease without dyskinesia, without mention of fluctuations: Secondary | ICD-10-CM

## 2024-03-27 DIAGNOSIS — G8929 Other chronic pain: Secondary | ICD-10-CM

## 2024-03-27 MED ORDER — CARBIDOPA-LEVODOPA 25-100 MG PO TABS: 1.0000 | ORAL_TABLET | Freq: Three times a day (TID) | ORAL | 1 refills | Status: AC

## 2024-03-27 NOTE — Patient Instructions (Signed)

## 2024-04-03 ENCOUNTER — Ambulatory Visit: Admitting: Obstetrics and Gynecology

## 2024-04-04 ENCOUNTER — Ambulatory Visit: Admitting: Obstetrics and Gynecology

## 2024-04-04 ENCOUNTER — Encounter: Payer: Self-pay | Admitting: Obstetrics and Gynecology

## 2024-04-04 VITALS — BP 124/74 | HR 77 | Wt 142.0 lb

## 2024-04-04 DIAGNOSIS — M810 Age-related osteoporosis without current pathological fracture: Secondary | ICD-10-CM | POA: Diagnosis not present

## 2024-04-04 DIAGNOSIS — Z1211 Encounter for screening for malignant neoplasm of colon: Secondary | ICD-10-CM

## 2024-04-04 DIAGNOSIS — Z01419 Encounter for gynecological examination (general) (routine) without abnormal findings: Secondary | ICD-10-CM

## 2024-04-04 DIAGNOSIS — Z1231 Encounter for screening mammogram for malignant neoplasm of breast: Secondary | ICD-10-CM

## 2024-04-04 DIAGNOSIS — M818 Other osteoporosis without current pathological fracture: Secondary | ICD-10-CM
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
## 2024-04-05 ENCOUNTER — Ambulatory Visit: Payer: Self-pay | Admitting: Obstetrics and Gynecology

## 2024-04-05 ENCOUNTER — Ambulatory Visit

## 2024-04-05 LAB — COMPREHENSIVE METABOLIC PANEL WITH GFR
AG Ratio: 1.6 (calc) (ref 1.0–2.5)
ALT: 4 U/L — ABNORMAL LOW (ref 6–29)
AST: 17 U/L (ref 10–35)
Albumin: 4.3 g/dL (ref 3.6–5.1)
Alkaline phosphatase (APISO): 73 U/L (ref 37–153)
BUN: 11 mg/dL (ref 7–25)
CO2: 26 mmol/L (ref 20–32)
Calcium: 8.9 mg/dL (ref 8.6–10.4)
Chloride: 102 mmol/L (ref 98–110)
Creat: 1 mg/dL (ref 0.60–1.00)
Globulin: 2.7 g/dL (ref 1.9–3.7)
Glucose, Bld: 89 mg/dL (ref 65–99)
Potassium: 4.4 mmol/L (ref 3.5–5.3)
Sodium: 137 mmol/L (ref 135–146)
Total Bilirubin: 0.7 mg/dL (ref 0.2–1.2)
Total Protein: 7 g/dL (ref 6.1–8.1)
eGFR: 61 mL/min/1.73m2 (ref >=60)

## 2024-04-05 LAB — LIPID PANEL
Cholesterol: 209 mg/dL — ABNORMAL HIGH (ref <200)
HDL: 54 mg/dL (ref >=50)
LDL Cholesterol (Calc): 132 mg/dL — ABNORMAL HIGH
Non-HDL Cholesterol (Calc): 155 mg/dL — ABNORMAL HIGH (ref <130)
Total CHOL/HDL Ratio: 3.9 (calc) (ref <5.0)
Triglycerides: 121 mg/dL (ref <150)

## 2024-04-05 LAB — TSH: TSH: 1.37 m[IU]/L (ref 0.40–4.50)

## 2024-04-05 LAB — VITAMIN D 25 HYDROXY (VIT D DEFICIENCY, FRACTURES): Vit D, 25-Hydroxy: 32 ng/mL (ref 30–100)

## 2024-04-12 ENCOUNTER — Other Ambulatory Visit: Payer: Self-pay

## 2024-04-13 ENCOUNTER — Ambulatory Visit: Payer: Medicare Other | Admitting: Internal Medicine

## 2024-04-13 ENCOUNTER — Ambulatory Visit: Admitting: Physical Therapy

## 2024-04-13 ENCOUNTER — Ambulatory Visit: Admitting: Occupational Therapy
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
## 2024-04-17 ENCOUNTER — Other Ambulatory Visit (HOSPITAL_BASED_OUTPATIENT_CLINIC_OR_DEPARTMENT_OTHER): Payer: Self-pay
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
## 2024-05-02 ENCOUNTER — Ambulatory Visit: Attending: Neurology | Admitting: Physical Therapy

## 2024-05-02 ENCOUNTER — Ambulatory Visit: Admitting: Occupational Therapy
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
## 2024-05-10 ENCOUNTER — Encounter: Admitting: Family

## 2024-05-10 ENCOUNTER — Other Ambulatory Visit (HOSPITAL_BASED_OUTPATIENT_CLINIC_OR_DEPARTMENT_OTHER): Payer: Self-pay

## 2024-05-10 MED ORDER — OXYCODONE HCL 5 MG PO TABS
5.0000 mg | ORAL_TABLET | Freq: Every day | ORAL | 0 refills | Status: DC | PRN
  Filled 2024-05-10: qty 30, 30d supply, fill #0

## 2024-05-10 MED ORDER — PREGABALIN 150 MG PO CAPS
150.0000 mg | ORAL_CAPSULE | Freq: Two times a day (BID) | ORAL | 1 refills | Status: AC
  Filled 2024-05-17: qty 60, 30d supply, fill #0
  Filled 2024-06-14 – 2024-06-15 (×2): qty 60, 30d supply, fill #1

## 2024-05-11 ENCOUNTER — Other Ambulatory Visit (HOSPITAL_BASED_OUTPATIENT_CLINIC_OR_DEPARTMENT_OTHER): Payer: Self-pay

## 2024-05-11 ENCOUNTER — Ambulatory Visit: Admitting: Physical Therapy

## 2024-05-14 ENCOUNTER — Encounter: Payer: Self-pay | Admitting: Physical Therapy

## 2024-05-14 ENCOUNTER — Ambulatory Visit: Attending: Neurology | Admitting: Physical Therapy

## 2024-05-14 VITALS — BP 144/72 | HR 62

## 2024-05-14 DIAGNOSIS — R2681 Unsteadiness on feet: Secondary | ICD-10-CM | POA: Diagnosis present

## 2024-05-14 DIAGNOSIS — M6281 Muscle weakness (generalized): Secondary | ICD-10-CM | POA: Insufficient documentation

## 2024-05-14 DIAGNOSIS — G20A1 Parkinson's disease without dyskinesia, without mention of fluctuations: Secondary | ICD-10-CM | POA: Diagnosis not present

## 2024-05-14 DIAGNOSIS — M5459 Other low back pain: Secondary | ICD-10-CM | POA: Diagnosis present
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
## 2024-05-15 ENCOUNTER — Encounter: Admitting: Family

## 2024-05-18 ENCOUNTER — Other Ambulatory Visit (HOSPITAL_BASED_OUTPATIENT_CLINIC_OR_DEPARTMENT_OTHER): Payer: Self-pay

## 2024-05-22 ENCOUNTER — Encounter: Admitting: Family

## 2024-05-29 ENCOUNTER — Ambulatory Visit: Attending: Neurology | Admitting: Physical Therapy

## 2024-05-29 VITALS — BP 141/85 | HR 71

## 2024-05-29 DIAGNOSIS — R2681 Unsteadiness on feet: Secondary | ICD-10-CM | POA: Insufficient documentation

## 2024-05-29 DIAGNOSIS — M5459 Other low back pain: Secondary | ICD-10-CM | POA: Diagnosis present

## 2024-05-29 DIAGNOSIS — R2689 Other abnormalities of gait and mobility: Secondary | ICD-10-CM | POA: Insufficient documentation

## 2024-05-29 DIAGNOSIS — M6281 Muscle weakness (generalized): Secondary | ICD-10-CM | POA: Diagnosis present
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
## 2024-06-01 ENCOUNTER — Encounter: Payer: Self-pay | Admitting: Family

## 2024-06-01 ENCOUNTER — Ambulatory Visit: Admitting: Family

## 2024-06-01 VITALS — BP 160/92 | HR 71 | Temp 97.0°F | Ht 60.0 in | Wt 145.4 lb

## 2024-06-01 DIAGNOSIS — Z1239 Encounter for other screening for malignant neoplasm of breast: Secondary | ICD-10-CM

## 2024-06-01 DIAGNOSIS — I10 Essential (primary) hypertension: Secondary | ICD-10-CM

## 2024-06-01 DIAGNOSIS — M810 Age-related osteoporosis without current pathological fracture: Secondary | ICD-10-CM

## 2024-06-01 DIAGNOSIS — G20A1 Parkinson's disease without dyskinesia, without mention of fluctuations: Secondary | ICD-10-CM | POA: Diagnosis not present

## 2024-06-01 DIAGNOSIS — Z122 Encounter for screening for malignant neoplasm of respiratory organs: Secondary | ICD-10-CM

## 2024-06-01 DIAGNOSIS — Z0000 Encounter for general adult medical examination without abnormal findings: Secondary | ICD-10-CM | POA: Diagnosis not present

## 2024-06-01 DIAGNOSIS — E782 Mixed hyperlipidemia: Secondary | ICD-10-CM | POA: Diagnosis not present

## 2024-06-01 LAB — COMPREHENSIVE METABOLIC PANEL WITH GFR
ALT: 2 U/L — ABNORMAL LOW (ref 3–35)
AST: 14 U/L (ref 5–37)
Albumin: 4.2 g/dL (ref 3.5–5.2)
Alkaline Phosphatase: 62 U/L (ref 39–117)
BUN: 10 mg/dL (ref 6–23)
CO2: 27 meq/L (ref 19–32)
Calcium: 8.5 mg/dL (ref 8.4–10.5)
Chloride: 103 meq/L (ref 96–112)
Creatinine, Ser: 0.95 mg/dL (ref 0.40–1.20)
GFR: 60.77 mL/min
Glucose, Bld: 94 mg/dL (ref 70–99)
Potassium: 4.2 meq/L (ref 3.5–5.1)
Sodium: 138 meq/L (ref 135–145)
Total Bilirubin: 0.7 mg/dL (ref 0.2–1.2)
Total Protein: 6.9 g/dL (ref 6.0–8.3)

## 2024-06-01 LAB — CBC WITH DIFFERENTIAL/PLATELET
Basophils Absolute: 0 K/uL (ref 0.0–0.1)
Basophils Relative: 0.3 % (ref 0.0–3.0)
Eosinophils Absolute: 0.1 K/uL (ref 0.0–0.7)
Eosinophils Relative: 1.8 % (ref 0.0–5.0)
HCT: 37.8 % (ref 36.0–46.0)
Hemoglobin: 12.5 g/dL (ref 12.0–15.0)
Lymphocytes Relative: 22.3 % (ref 12.0–46.0)
Lymphs Abs: 1.7 K/uL (ref 0.7–4.0)
MCHC: 33.2 g/dL (ref 30.0–36.0)
MCV: 84.3 fl (ref 78.0–100.0)
Monocytes Absolute: 0.5 K/uL (ref 0.1–1.0)
Monocytes Relative: 6.3 % (ref 3.0–12.0)
Neutro Abs: 5.4 K/uL (ref 1.4–7.7)
Neutrophils Relative %: 69.3 % (ref 43.0–77.0)
Platelets: 265 K/uL (ref 150.0–400.0)
RBC: 4.48 Mil/uL (ref 3.87–5.11)
RDW: 14.3 % (ref 11.5–15.5)
WBC: 7.8 K/uL (ref 4.0–10.5)

## 2024-06-01 NOTE — Patient Instructions (Addendum)
 It was very nice to see you today!   I will review your lab results via MyChart in a few days. I have sent in the medication to start taking for your high cholesterol, take as directed on the bottle.  We will recheck your levels in 3-6 months.  You look great! Stay well! Exercise more!     PLEASE NOTE:  If you had any lab tests please let us  know if you have not heard back within a few days. You may see your results on MyChart before we have a chance to review them but we will give you a call once they are reviewed by us . If we ordered any referrals today, please let us  know if you have not heard from their office within the next week.
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
## 2024-06-04 ENCOUNTER — Other Ambulatory Visit: Payer: Self-pay

## 2024-06-04 ENCOUNTER — Encounter (HOSPITAL_COMMUNITY): Payer: Self-pay

## 2024-06-04 ENCOUNTER — Telehealth: Payer: Self-pay

## 2024-06-04 ENCOUNTER — Other Ambulatory Visit (HOSPITAL_COMMUNITY): Payer: Self-pay

## 2024-06-04 DIAGNOSIS — E782 Mixed hyperlipidemia: Secondary | ICD-10-CM

## 2024-06-04 MED ORDER — ROSUVASTATIN CALCIUM 5 MG PO TABS
5.0000 mg | ORAL_TABLET | ORAL | 1 refills | Status: AC
  Filled 2024-06-04 – 2024-06-05 (×2): qty 72, 90d supply, fill #0

## 2024-06-04 NOTE — Telephone Encounter (Signed)
 RX sent

## 2024-06-04 NOTE — Telephone Encounter (Signed)
 let pt know plz

## 2024-06-04 NOTE — Telephone Encounter (Signed)
 Patient notified.

## 2024-06-04 NOTE — Telephone Encounter (Signed)
 Copied from CRM #[id number]. Topic: Clinical - Medication Question >> Jun 01, 2024  4:32 PM [name] wrote: Reason for CRM: Pt called in to check the status of her medication that was supposed to be sent over to the pharmacy for high cholesterol but nothing has been sent. I called [facility] and she could not locate any new script either. Please send new script to the [facility] pharmacy on file and let pt know when this has been done so pt can be aware to pick up over the weekend. Thank you    Patient states during OV you recommended a medication for cholesterol. Patient would like this into the pharmacy. I will reach back out to patient once done.

## 2024-06-04 NOTE — Addendum Note (Signed)
 Addended by: Refael Fulop on: 06/04/2024 01:53 PM   Modules accepted: Orders

## 2024-06-05 ENCOUNTER — Other Ambulatory Visit (HOSPITAL_BASED_OUTPATIENT_CLINIC_OR_DEPARTMENT_OTHER): Payer: Self-pay

## 2024-06-05 ENCOUNTER — Ambulatory Visit: Payer: Self-pay | Admitting: Family

## 2024-06-07 ENCOUNTER — Ambulatory Visit: Admitting: Physical Therapy

## 2024-06-07 VITALS — BP 152/79 | HR 66

## 2024-06-07 DIAGNOSIS — M6281 Muscle weakness (generalized): Secondary | ICD-10-CM

## 2024-06-07 DIAGNOSIS — R2681 Unsteadiness on feet: Secondary | ICD-10-CM | POA: Diagnosis not present

## 2024-06-07 DIAGNOSIS — M5459 Other low back pain: Secondary | ICD-10-CM

## 2024-06-07 DIAGNOSIS — R2689 Other abnormalities of gait and mobility: Secondary | ICD-10-CM
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
## 2024-06-11 ENCOUNTER — Other Ambulatory Visit (HOSPITAL_COMMUNITY): Payer: Self-pay

## 2024-06-12 ENCOUNTER — Encounter: Payer: Self-pay | Admitting: Physical Therapy

## 2024-06-12 ENCOUNTER — Ambulatory Visit: Admitting: Physical Therapy

## 2024-06-12 VITALS — BP 141/82 | HR 75

## 2024-06-12 DIAGNOSIS — R2681 Unsteadiness on feet: Secondary | ICD-10-CM | POA: Diagnosis not present

## 2024-06-12 DIAGNOSIS — M5459 Other low back pain: Secondary | ICD-10-CM

## 2024-06-12 DIAGNOSIS — M6281 Muscle weakness (generalized): Secondary | ICD-10-CM
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
## 2024-06-13 ENCOUNTER — Ambulatory Visit: Payer: Self-pay | Admitting: Family

## 2024-06-14 ENCOUNTER — Other Ambulatory Visit: Payer: Self-pay

## 2024-06-14 ENCOUNTER — Other Ambulatory Visit (HOSPITAL_BASED_OUTPATIENT_CLINIC_OR_DEPARTMENT_OTHER): Payer: Self-pay

## 2024-06-14 ENCOUNTER — Telehealth: Payer: Self-pay

## 2024-06-14 MED ORDER — OXYCODONE HCL 5 MG PO TABS
5.0000 mg | ORAL_TABLET | Freq: Every day | ORAL | 0 refills | Status: AC | PRN
  Filled 2024-06-14: qty 30, 30d supply, fill #0

## 2024-06-14 NOTE — Telephone Encounter (Signed)
 Copied from CRM #[id number]. Topic: Referral - Question >> Jun 14, 2024  2:19 PM Taleah C wrote: Reason for CRM: pt called and asked for pcp to give her a call because she needs help understanding if she needs a new referral sent to all the specialty practices she is established with already. She stated she got this information from her insurance and needs clarification. Please call and advise.

## 2024-06-14 NOTE — Telephone Encounter (Signed)
 Returned patient call, I was unable [email or web address] reach patient at this time but I did leave a voicemail for patient.

## 2024-06-15 ENCOUNTER — Other Ambulatory Visit: Payer: Self-pay

## 2024-06-15 DIAGNOSIS — M5430 Sciatica, unspecified side: Secondary | ICD-10-CM

## 2024-06-15 DIAGNOSIS — G20A1 Parkinson's disease without dyskinesia, without mention of fluctuations: Secondary | ICD-10-CM

## 2024-06-15 DIAGNOSIS — M810 Age-related osteoporosis without current pathological fracture: Secondary | ICD-10-CM

## 2024-07-04 ENCOUNTER — Ambulatory Visit: Attending: Neurology | Admitting: Occupational Therapy

## 2024-08-30 ENCOUNTER — Ambulatory Visit: Admitting: Family

## 2024-09-27 ENCOUNTER — Ambulatory Visit: Admitting: Neurology

## 2025-04-02 ENCOUNTER — Ambulatory Visit
